# Patient Record
Sex: Male | Born: 1945 | Race: White | Hispanic: No | Smoking: Former smoker
Health system: Southern US, Community
[De-identification: ages and names within clinical notes are randomized; demographics above are authoritative.]

## PROBLEM LIST (undated history)

## (undated) DIAGNOSIS — F419 Anxiety disorder, unspecified: Secondary | ICD-10-CM

## (undated) DIAGNOSIS — Z87442 Personal history of urinary calculi: Secondary | ICD-10-CM

## (undated) DIAGNOSIS — E039 Hypothyroidism, unspecified: Secondary | ICD-10-CM

## (undated) DIAGNOSIS — F32A Depression, unspecified: Secondary | ICD-10-CM

## (undated) DIAGNOSIS — G4730 Sleep apnea, unspecified: Secondary | ICD-10-CM

## (undated) DIAGNOSIS — K219 Gastro-esophageal reflux disease without esophagitis: Secondary | ICD-10-CM

## (undated) DIAGNOSIS — H409 Unspecified glaucoma: Secondary | ICD-10-CM

## (undated) DIAGNOSIS — I10 Essential (primary) hypertension: Secondary | ICD-10-CM

## (undated) DIAGNOSIS — M1990 Unspecified osteoarthritis, unspecified site: Secondary | ICD-10-CM

## (undated) DIAGNOSIS — F329 Major depressive disorder, single episode, unspecified: Secondary | ICD-10-CM

## (undated) HISTORY — DX: Gastro-esophageal reflux disease without esophagitis: K21.9

## (undated) HISTORY — DX: Anxiety disorder, unspecified: F41.9

## (undated) HISTORY — PX: CHOLECYSTECTOMY: SHX55

## (undated) HISTORY — DX: Essential (primary) hypertension: I10

## (undated) HISTORY — PX: BACK SURGERY: SHX140

---

## 1998-02-01 ENCOUNTER — Encounter: Payer: Self-pay | Admitting: Emergency Medicine

## 1998-02-01 ENCOUNTER — Emergency Department (HOSPITAL_COMMUNITY): Admission: EM | Admit: 1998-02-01 | Discharge: 1998-02-01 | Payer: Self-pay | Admitting: Emergency Medicine

## 2001-05-19 ENCOUNTER — Ambulatory Visit (HOSPITAL_COMMUNITY): Admission: RE | Admit: 2001-05-19 | Discharge: 2001-05-19 | Payer: Self-pay | Admitting: Family Medicine

## 2001-05-19 ENCOUNTER — Encounter: Payer: Self-pay | Admitting: Family Medicine

## 2001-06-17 ENCOUNTER — Ambulatory Visit (HOSPITAL_COMMUNITY): Admission: RE | Admit: 2001-06-17 | Discharge: 2001-06-17 | Payer: Self-pay | Admitting: Family Medicine

## 2001-06-17 ENCOUNTER — Encounter: Payer: Self-pay | Admitting: Family Medicine

## 2001-10-09 ENCOUNTER — Encounter: Payer: Self-pay | Admitting: Family Medicine

## 2001-10-09 ENCOUNTER — Ambulatory Visit (HOSPITAL_COMMUNITY): Admission: RE | Admit: 2001-10-09 | Discharge: 2001-10-09 | Payer: Self-pay | Admitting: Family Medicine

## 2002-03-12 HISTORY — PX: ESOPHAGOGASTRODUODENOSCOPY: SHX1529

## 2002-09-19 ENCOUNTER — Emergency Department (HOSPITAL_COMMUNITY): Admission: EM | Admit: 2002-09-19 | Discharge: 2002-09-19 | Payer: Self-pay | Admitting: Emergency Medicine

## 2002-10-02 ENCOUNTER — Ambulatory Visit (HOSPITAL_COMMUNITY): Admission: RE | Admit: 2002-10-02 | Discharge: 2002-10-02 | Payer: Self-pay | Admitting: Family Medicine

## 2002-10-02 ENCOUNTER — Encounter: Payer: Self-pay | Admitting: Family Medicine

## 2002-10-06 ENCOUNTER — Encounter (HOSPITAL_COMMUNITY): Admission: RE | Admit: 2002-10-06 | Discharge: 2002-11-05 | Payer: Self-pay | Admitting: Family Medicine

## 2002-10-06 ENCOUNTER — Encounter: Payer: Self-pay | Admitting: Family Medicine

## 2002-10-16 ENCOUNTER — Observation Stay (HOSPITAL_COMMUNITY): Admission: RE | Admit: 2002-10-16 | Discharge: 2002-10-18 | Payer: Self-pay | Admitting: General Surgery

## 2002-11-20 ENCOUNTER — Ambulatory Visit (HOSPITAL_COMMUNITY): Admission: RE | Admit: 2002-11-20 | Discharge: 2002-11-20 | Payer: Self-pay | Admitting: Family Medicine

## 2002-11-20 ENCOUNTER — Encounter: Payer: Self-pay | Admitting: Family Medicine

## 2002-12-21 ENCOUNTER — Ambulatory Visit (HOSPITAL_COMMUNITY): Admission: RE | Admit: 2002-12-21 | Discharge: 2002-12-21 | Payer: Self-pay | Admitting: Internal Medicine

## 2003-04-22 ENCOUNTER — Ambulatory Visit (HOSPITAL_COMMUNITY): Admission: RE | Admit: 2003-04-22 | Discharge: 2003-04-22 | Payer: Self-pay | Admitting: Family Medicine

## 2003-09-08 ENCOUNTER — Ambulatory Visit (HOSPITAL_COMMUNITY): Admission: RE | Admit: 2003-09-08 | Discharge: 2003-09-08 | Payer: Self-pay | Admitting: Family Medicine

## 2004-11-23 ENCOUNTER — Ambulatory Visit (HOSPITAL_COMMUNITY): Admission: RE | Admit: 2004-11-23 | Discharge: 2004-11-23 | Payer: Self-pay | Admitting: Family Medicine

## 2005-02-14 ENCOUNTER — Ambulatory Visit (HOSPITAL_COMMUNITY): Admission: RE | Admit: 2005-02-14 | Discharge: 2005-02-14 | Payer: Self-pay | Admitting: Family Medicine

## 2006-02-11 ENCOUNTER — Inpatient Hospital Stay (HOSPITAL_COMMUNITY): Admission: AD | Admit: 2006-02-11 | Discharge: 2006-02-14 | Payer: Self-pay | Admitting: Family Medicine

## 2006-02-28 ENCOUNTER — Ambulatory Visit (HOSPITAL_COMMUNITY): Admission: RE | Admit: 2006-02-28 | Discharge: 2006-03-01 | Payer: Self-pay | Admitting: Neurosurgery

## 2007-10-31 ENCOUNTER — Emergency Department (HOSPITAL_COMMUNITY): Admission: EM | Admit: 2007-10-31 | Discharge: 2007-10-31 | Payer: Self-pay | Admitting: Emergency Medicine

## 2007-11-05 ENCOUNTER — Ambulatory Visit (HOSPITAL_COMMUNITY): Admission: RE | Admit: 2007-11-05 | Discharge: 2007-11-05 | Payer: Self-pay | Admitting: Urology

## 2007-11-07 ENCOUNTER — Ambulatory Visit (HOSPITAL_COMMUNITY): Admission: RE | Admit: 2007-11-07 | Discharge: 2007-11-07 | Payer: Self-pay | Admitting: Urology

## 2007-11-11 ENCOUNTER — Ambulatory Visit (HOSPITAL_COMMUNITY): Admission: RE | Admit: 2007-11-11 | Discharge: 2007-11-11 | Payer: Self-pay | Admitting: Urology

## 2007-11-13 ENCOUNTER — Ambulatory Visit (HOSPITAL_COMMUNITY): Admission: RE | Admit: 2007-11-13 | Discharge: 2007-11-13 | Payer: Self-pay | Admitting: Urology

## 2010-04-01 ENCOUNTER — Encounter: Payer: Self-pay | Admitting: Internal Medicine

## 2010-07-25 NOTE — H&P (Signed)
NAME:  John Melendez, John Melendez                ACCOUNT NO.:  [id number]   MEDICAL RECORD NO.:  [id number]          PATIENT TYPE:  AMB   LOCATION:  DAY                           FACILITY:  [facility]   PHYSICIAN:  Dennie Maizes, M.D.   DATE OF BIRTH:  02/06/1946   DATE OF ADMISSION:  DATE OF DISCHARGE:  LH                              HISTORY & PHYSICAL   CHIEF COMPLAINT:  Right flank pain, right distal ureteral calculi with  obstruction.   HISTORY OF PRESENT ILLNESS:  This 65 year old male was referred today  from the emergency room at [facility].   PAST HISTORY:  Recurrent urolithiasis.   PAST SURGERIES:  Three or four stones in the past.   He came to emergency room on November 10, 2007.  He had severe right flank  pain radiating to the front.  Evaluation was done with a CT scan of  abdomen and pelvis without contrast.  This revealed a 9 x 6-mm size  right distal calculi on the lower left pelvic brim with obstruction  hydronephrosis.  The patient has undergone extracorporeal shockwave  lithotripsy of obstructing right distal ureteral calculus.  Stone has  been fragmented well.  He passed small stone fragments.  He still has a  group of stone fragments at the right distal ureter with obstruction and  severe pain.  He is brought to the short-stay center today for  cystoscopy, retrograde pyelogram, right ureteroscopy, Holmium  lithotripsy and stent placement.   PAST MEDICAL HISTORY:  1. History of recurrent urolithiasis status post ESL of right distal      ureteral calculus in August 2009.  2. Hypertension.  3. Anxiety.  4. Vitiligo.  5. Status post cholecystectomy.  6. Status post this surgery.   MEDICATIONS:  Nasacort, Claritin, Xanax, Cardura, over-the-counter  medications for gastroesophageal reflux disease, Percocet p.r.n. for  pain.   ALLERGIES:  PENICILLIN.   PHYSICAL EXAMINATION:  Height 6 feet, 1 inch; weight 200 pounds.  HEAD, EYES, EARS, NOSE AND THROAT: Normal.  LUNGS:  Clear to auscultation.  HEART: Regular rate and rhythm.  No murmurs.  ABDOMEN: Soft.  No palpable flank mass.  Moderate right costovertebral  angle tenderness is noted.  Penis and testes are normal.  The patient has hyperpigmented patches all over the body suggestive of  vitiligo.   IMPRESSION:  Right distal ureteral calculus status post extracorporeal  shock wave lithotripsy (ESWL) , right distal ureteral stone fragments  with obstruction, right renal colic.   PLAN:  Cystoscopy, retrograde pyelogram, right ureteroscopy, holmium  laser lithotripsy and extraction of stones and stent placement in short-  stay center.  I have discussed with the patient and his wife regarding  the diagnosis, operative details, alternative treatments and outcomes,  possible risks and complications and he has agreed for the procedure to  be done.      Dennie Maizes, M.D.  Electronically Signed     SK/MEDQ  D:  11/12/2007  T:  11/12/2007  Job:  [id number]   cc:   Short Stay Center   Kirk Ruths, M.D.  Fax: [phone]

## 2010-07-25 NOTE — Op Note (Signed)
NAME:  John Melendez, John Melendez                ACCOUNT NO.:  [id number]   MEDICAL RECORD NO.:  [id number]          PATIENT TYPE:  AMB   LOCATION:  DAY                           FACILITY:  [facility]   PHYSICIAN:  Dennie Maizes, M.D.   DATE OF BIRTH:  Dec 10, 1945   DATE OF PROCEDURE:  11/13/2007  DATE OF DISCHARGE:                               OPERATIVE REPORT   PREOPERATIVE DIAGNOSES:  Right distal ureteral calculus with  obstruction, right flank pain.   POSTOPERATIVE DIAGNOSES:  Right distal ureteral calculus with  obstruction, right flank pain.   OPERATIVE PROCEDURES:  Cystoscopy, retrograde pyelogram, right  ureteroscopy, basket extraction of stone, and right ureteral stent  placement.   ANESTHESIA:  General.   SURGEON:  Dennie Maizes, MD   COMPLICATIONS:  None.   DRAINS:  6.26 cm size right ureteral stent with a string.   ESTIMATED BLOOD LOSS:  Minimal.   SPECIMEN:  Right ureteral stone, which was given to the patient.   COMPLICATIONS:  None.   INDICATIONS FOR THE PROCEDURE:  This is a 65 year old male had a large  right ureteral calculus obstruction.  The stone measured 96 mm in size.  It was treated with the ESL as an outpatient.  The past small stone  fragments.  X-rays revealed a large stone fragment with obstruction and  severe pain.  The patient was brought to the OR today for cystoscopy,  retrograde pyelogram, right ureteroscopy stone extraction, and stent  placement.   DESCRIPTION OF PROCEDURE:  General anesthesia was induced and the  patient was placed on the OR table in the dorsal lithotomy position.  The lower abdomen and the liver were prepped and draped in a sterile  fashion.  Cystoscopy was done at 25-French scope.  Urethra was normal.  There was moderate hypertrophy of prostate partial obstruction of  bladder neck area.  The bladder found to be normal.  A 5-French wedge  catheter was then placed in the right ureteral orifice and about 7 mL of  Renografin-60 was  injected into the collecting system.  There was large  filling defect in the distal ureter just above ureterovesical junction.  There was proximal hydroureter and hydronephrosis.   A 5-French open-ended catheter was then placed in right ureteral  arteries.  A 0.038 Bentson guidewire with a flexible tip was then  advanced in the right renal pelvis.  The distal ureter was then dilated  using a 18 French 6-cm size balloon dilating catheter.  Balloon dilating  catheter was then removed leaving the guidewire in place.   Ureteroscopy was done with a 8.5-French rigid ureteroscope.  There was  large stone fragments with several small stone fragments in the right  distal ureter.  The nitinol 0 tip wire basket, the last stone fragment  was removed.  The small stone fragments washed out with the irrigation  fluid.  Examination of the ureter was done up to the pelvic brim and no  other abnormality was noted.  There was proximal hydroureter.   A 6.26-cm size stent with a string was then inserted into the right  collecting  system.  The instruments were removed.  The patient was  transferred to the PACU in a satisfactory condition.      Dennie Maizes, M.D.  Electronically Signed     SK/MEDQ  D:  11/13/2007  T:  11/14/2007  Job:  [id number]   cc:   Kirk Ruths, M.D.  Fax: [phone]

## 2010-07-25 NOTE — H&P (Signed)
NAME:  John Melendez, John Melendez                ACCOUNT NO.:  [id number]   MEDICAL RECORD NO.:  [id number]          PATIENT TYPE:  AMB   LOCATION:  DAY                           FACILITY:  [facility]   PHYSICIAN:  Dennie Maizes, M.D.   DATE OF BIRTH:  11-24-1945   DATE OF ADMISSION:  DATE OF DISCHARGE:  LH                              HISTORY & PHYSICAL   He is coming to the short-stay center on November 05, 2007, for  lithotripsy of right distal ureteral calculus.   HISTORY OF PRESENT ILLNESS:  This 65 year old male was referred to me  from the emergency room at [facility].  He has a past history  of recurrent urolithiasis.  He has passed at least 3-4 stones in the  past.  He went to the emergency room on October 31, 2007, with severe  right flank pain radiating to the front.  Evaluation was done with a CT  scan of the abdomen and pelvis without contrast.  This revealed a 9-mm  size right distal ureteral calculus at the level of the pelvic brim with  obstruction and hydronephrosis.  The patient also had small  nonobstructing right lower caliceal stone in the right kidney.  The  patient is unable to pass the stone.  He has urinary frequency x5-6 and  nocturia x1.  He is brought to the short-stay center today for  extracorporeal shock wave lithotripsy of obstructing right distal  ureteral calculus.   PAST MEDICAL HISTORY:  1. History of recurrent urolithiasis.  2. Hypertension.  3. Anxiety.  4. Vitiligo.  5. Status post cholecystectomy.  6. Status post disk surgery.   MEDICATIONS:  Nasacort, Claritin, Xanax, Cardura, over-the-counter  medication for gastroesophageal reflux disease.   ALLERGIES:  HE IS ALLERGIC TO PENICILLIN.   PHYSICAL EXAMINATION:  VITAL SIGNS:  Height 6'1, weight 200 pounds.  SKIN:  The patient has extensive hypopigmented lesions suggestive of  vitiligo.  HEAD, EYES, EARS, NOSE AND THROAT: Normal.  LUNGS:  Clear to auscultation.  HEART:  Regular rate and rhythm.  No  murmurs.  ABDOMEN:  Soft.  No palpable flank mass.  Moderate right costovertebral  angle tenderness was noted.  Bladder not palpable.  Penis and testes are  normal   IMPRESSION:  Right distal ureteral calculus with obstruction (9-mm),  right hydronephrosis, right renal colic.   PLAN:  Extracorporeal shock wave lithotripsy of obstructing right distal  ureteral calculi with IV sedation in the short-stay center.  I have  informed the patient and his family regarding the diagnosis, operative  details, alternative treatments, outcome and possible risks and  complications, and he has agreed for the procedure to be done.      Dennie Maizes, M.D.  Electronically Signed     SK/MEDQ  D:  11/05/2007  T:  11/05/2007  Job:  [id number]   cc:   Kirk Ruths, M.D.  Fax: [phone]   [facility]

## 2010-07-28 NOTE — Op Note (Signed)
   NAME:  John Melendez, John Melendez                          ACCOUNT NO.:  [id number]   MEDICAL RECORD NO.:  [id number]                   PATIENT TYPE:  AMB   LOCATION:  DAY                                  FACILITY:  [facility]   PHYSICIAN:  R. Roetta Sessions, M.D.              DATE OF BIRTH:  August 23, 1945   DATE OF PROCEDURE:  12/21/2002  DATE OF DISCHARGE:                                 OPERATIVE REPORT   PROCEDURE:  Esophagogastroduodenoscopy with Elease Hashimoto dilation.   ENDOSCOPIST:  Gerrit Friends. Rourk, M.D.   INDICATIONS FOR PROCEDURE:  The patient is a 65 year old gentleman with  upper abdominal pain and esophageal dysphagia to solids. EGD is now being  done to further evaluate his symptoms.  He has been on Aciphex 20 mg orally  daily with good control of his reflux symptoms.  Please see my dictated H&P,  December 09, 2002, for more information.   PROCEDURE NOTE:  O2 saturation, blood pressure, pulse and respirations were  monitored throughout the entire procedure.  Conscious sedation: Versed 5 mg  IV, Demerol 125 mg IV in divided doses.   INSTRUMENT:  Olympus video chip adult gastroscope.   FINDINGS:  Examination of the tubular esophagus revealed noncritical  appearing Schatzki ring; otherwise the esophageal mucosa appeared normal.  The EG junction was easily traversed.   STOMACH:  The gastric cavity was empty.  It insufflated well with air.  A  thorough examination of the gastric mucosa including a retroflex view of the  proximal stomach and esophagogastric junction demonstrated only a small  hiatal hernia. The pylorus was patent and easily traversed.   DUODENUM:  The bulb and the second portion appeared normal.   THERAPEUTIC/DIAGNOSTIC MANEUVERS:  A 56 French Maloney dilator was passed to  full insertion with good patient tolerance. A look back revealed no apparent  complication related to the passage of the dilator.   The patient tolerated the procedure well and was reacted in  endoscopy.   IMPRESSION:  1. Noncritical appearing Schatzki ring, status post dilation as described     above, otherwise normal esophagus.  2.     Small hiatal hernia, otherwise normal stomach.  3. Normal D1 and D2.   RECOMMENDATIONS:  Also follow up with [place] in 1 month, continue Aciphex 20 mg  orally daily.      ___________________________________________                                            Jonathon Bellows, M.D.   RMR/MEDQ  D:  12/21/2002  T:  12/21/2002  Job:  [id number]   cc:   Corrie Mckusick, M.D.  [address] [place] [place]  [place]  [place] [address]  Fax: [phone]

## 2010-07-28 NOTE — Consult Note (Signed)
NAME:  John Melendez, John Melendez                          ACCOUNT NO.:  [id number]   MEDICAL RECORD NO.:  [id number]                   PATIENT TYPE:   LOCATION:                                       FACILITY:   PHYSICIAN:  R. Roetta Sessions, M.D.              DATE OF BIRTH:  03/13/1945   DATE OF CONSULTATION:  DATE OF DISCHARGE:                                   CONSULTATION   CONSULTING PHYSICIAN:  R. Roetta Sessions, M.D.   PRIMARY CARE PHYSICIAN:  Patrica Duel, M.D.   REASON FOR CONSULTATION:  Abdominal pain, back pain.   HISTORY OF PRESENT ILLNESS:  Mr. Shiven Junious is a pleasant 65 year old  gentleman kindly sent over courtesy of Dr. Patrica Duel to further evaluate  a six or so week history of upper abdominal back pain.  He has a long  history of what he describes as heartburn.  He has been on various  medications for acid suppression, over the years.  He also describes  esophageal dysphagia which he states is improved somewhat since he had his  gallbladder out six weeks ago by Dr. Malvin Johns.  Apparently he had biliary  dyskinesia and cholelithiasis.  I do not have any of those reports available  for review.  He tells me, after undergoing a HIDA scan (based on what he  describes to me), he started having back pain after he got off the table in  the x-ray department.  He does have some vague epigastric pain off and on,  sometimes relieved and sometimes exacerbated by eating.  No change in bowel  habits.  No melena, rectal bleeding, constipation, diarrhea.  No change in  weight.  He describes undergoing a barium pill esophagram one year ago which  apparently did not show any impediment to barium bill transit, although I do  not have that study for review at this time either.  He denies any form of  nonsteroidals.  He stopped smoking and drinking 11 years ago.  He takes  Aciphex 20 mg orally daily.   PAST MEDICAL HISTORY:  1. Significant for anxiety neurosis.  2. Hypertension.  3.  Seasonal allergies.   PAST SURGICAL HISTORY:  1. Cholecystectomy.  He has never his lower GI tract imaged.   CURRENT MEDICATIONS:  1. Xanax 0.5 mg p.r.n.  2. Cardura 6 mg daily.  3. Aciphex 20 mg daily.  4. Nasacort daily.  5. Claritin 10 mg daily.   ALLERGIES:  PENICILLIN.   FAMILY HISTORY:  Father died of unknown causes.  Mother had an MI and renal  failure.  No history of chronic GI or liver illness.   SOCIAL HISTORY:  The patient is single.  Has seven children.  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  No tobacco or alcohol for  the past 11 years.  Fairly heavy on both prior to that time.   REVIEW  OF SYSTEMS:  As in history of present illness.   PHYSICAL EXAMINATION:  Reveals a pleasant 65 year old gentleman who has a  mustache.  He does have a vitiliginous changes involving the skin of both  hands.  Weight 180.5, height 6 foot 1 inch, temp 98.4, BP 120/72, pulse 70.  SKIN:  Warm and dry.  Vitiligo over both distal upper extremities.  No  jaundice, no cutaneous stigmata of chronic liver disease.  HEENT:  No scleral icterus.  Oral cavity, no lesions.  JVD is not prominent.  CHEST:  Lungs are clear to auscultation.  CARDIAC:  Regular, rate and rhythm without murmur, gallop or rub.  ABDOMEN:  Nondistended.  Positive bowel sounds.  Soft, nontender without  appreciable mass, organomegaly.  He does not have any CVA tenderness or  tenderness to percussion over the spine.   IMPRESSION:  Mr. Danarius Mcconathy is a pleasant 65 year old gentleman with a  longstanding history of gastroesophageal reflux disease, intermittent  esophageal dysphagia somewhat improved after cholecystectomy recently.  He  has as much back pain as abdominal pain.  It does not sound like an aneurism  was detected on recent imaging studies, but I need to get the reports to  review.  He really needs to have an esophagogastroduodenoscopy if nothing  more to evaluate his longstanding reflux  symptoms.  We need to rule out  peptic ulcer disease.   RECOMMENDATIONS:  1. EGD with a possible esophageal dilation as appropriate at [facility] in the near future.  2. Review the multitude of labs and radiographic studies done on him     recently.  3. In the near future, we will make further recommendations.   I have discussed the approach of EGD with possible esophageal dilation with  Mr. Macphee, potential risks, benefits, alternatives have been reviewed,  questions answered.  He is agreeable.   I would like to thank Dr. Patrica Duel for allowing me to see this nice  gentleman today.  We will make further recommendations in the near future.      ___________________________________________                                            Jonathon Bellows, M.D.   RMR/MEDQ  D:  12/09/2002  T:  12/09/2002  Job:  [id number]   cc:   Patrica Duel, M.D.  [address]  [place]  [place] [address]  Fax: [phone]

## 2010-07-28 NOTE — Op Note (Signed)
NAME:  John Melendez, John Melendez                ACCOUNT NO.:  [id number]   MEDICAL RECORD NO.:  [id number]          PATIENT TYPE:  OIB   LOCATION:  3023                         FACILITY:  [facility]   PHYSICIAN:  Danae Orleans. Venetia Maxon, M.D.  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   PROCEDURE:  Left L5-S1 far lateral microdiskectomy with METRx Quadrant  retractor and a microdissection.   SURGEON:  Danae Orleans. Venetia Maxon, M.D.   ASSISTANT:  Georgiann Cocker, RN.   ANESTHESIA:  General endotracheal anesthesia.   BLOOD LOSS:  Minimal.   COMPLICATIONS:  None.   DISPOSITION:  Recovery.   INDICATIONS:  John Melendez is a 65 year old man with severe left leg  pain with an L5 radiculopathy with far lateral disk herniation at L5-S1  on the left.  It was elected to take him to surgery for far lateral  microdiskectomy.   PROCEDURE:  John Melendez was brought to the operating room.  Following  satisfactory and uncomplicated induction of general endotracheal  anesthesia and placement of intravenous lines, the patient was placed in  a prone position on the Wilson frame.  His low back was then prepped and  draped in the usual sterile fashion.  Using fluoroscopy, the left  paramedian approach to the left L5 pars was identified and a 2 cm  incision was made after the skin and subcutaneous tissues were  infiltrated with 0.25% Marcaine and 0.50% lidocaine with 1:200,000  epinephrine.  Incision was carried through skin and fascia and then  using initially a K-wire to localize the correct level and position and  then subsequently using a sequential dilators, dissection of soft tissue  was performed  overlying the left pars of L5 and then sequential dilation  was performed up to 22 mm cannula.  A 6 cm long x 22 mm Quadrant  retractor was then placed and anchored to the standard flex-arm bed  anchor.  The quadrant tractor was then opened and also medial, lateral  retractors were placed.  After a final a localizing x-ray was obtained,  the thin-layer of muscle overlying the pars was then removed with  electrocautery and a Leksell rongeur.  The lateral aspect of the pars  and superolateral facette were then thinned with a high-speed drill and  bone was removed with Kerrison rongeur.  This was done under loupe  magnification.  Subsequently, the microscope was brought into field and  under microdissection technique, an extruded fragment of disk was  identified, the nerve root was found to be pushed cephalad and under  fair amount of pressure.  Using micropituitary, a very large fragment of  disk material was removed with resultant significant decompression of  the nerve root.  The space beneath the fragment of disk was palpated  with a variety of hooks and there did  not appear to be any significant  residual disk material.  Several additional small pieces of disk  material were removed.  The epidural veins were identified and left  intact.  The nerve root appeared to be significantly decompressed.  Hemostasis was assured and the wound was irrigated with bacitracin and  saline and subsequently Depo-Medrol and fentanyl was placed into the  operative bed.  The Quadrant retractor was then removed and the fascia  was closed with 2-0 Vicryl sutures and the skin edges were approximated  2-0 and 3-0 Vicryl interrupted inverted sutures.  The wound was dressed  with Dermabond.  The patient was extubated in the operating room and  taken to the recovery room in stable satisfactory condition, having  tolerated his  operation well.  Counts correct at the end of the case.      Danae Orleans. Venetia Maxon, M.D.   Electronically Signed     JDS/MEDQ  D:  02/28/2006  T:  02/28/2006  Job:  [id number]

## 2010-07-28 NOTE — H&P (Signed)
NAME:  John Melendez, John Melendez                ACCOUNT NO.:  [id number]   MEDICAL RECORD NO.:  [id number]          PATIENT TYPE:  INP   LOCATION:  A331                          FACILITY:  [facility]   PHYSICIAN:  Kirk Ruths, M.D.DATE OF BIRTH:  1945/06/07   DATE OF ADMISSION:  02/11/2006  DATE OF DISCHARGE:  LH                              HISTORY & PHYSICAL   CHIEF COMPLAINT:  Severe back pain.   HISTORY OF PRESENT ILLNESS:  This is a 65 year old male who started  having low back pain approximately three weeks ago.  The patient, at the  time, was bending over and straining.  Initially was treated with  Flexeril and Lorcet Plus and rest.  The patient's pain actually got  worse.  He was seen again a week ago in the office at which time he was  put on steroid dose pack, increased to Lorcet 10 and MRI was ordered.  The patient has been unable to tolerated MRI twice, unable to lay on the  bed because of severe pain.  Pain began running down his left lower  extremity.  The patient was admitted for IV pain control and hopefully  he will have a diagnostic MRI.   ALLERGIES:  PENICILLIN.   PAST MEDICAL HISTORY:  1. Hypertension.  2. Vitiligo.  3. GERD.  4. Panic disorder.   MEDICATIONS:  1. Xanax 0.5 p.r.n.  2. Aciphex 20.  3. Cardura 6 mg daily.  4. Allegra and Nasacort p.r.n.  5. Enteric coated aspirin.   REVIEW OF SYSTEMS:  Denies chest pain, shortness of breath, nausea and  vomiting.  Does state he has severe burning and pain down his left leg  posteriorly to his foot.  Denies incontinence of stool or bladder.   SOCIAL HISTORY:  Nonsmoker, nondrinker.  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   PHYSICAL EXAMINATION:  GENERAL:  A middle aged male who appears in pain  and using a walker which is new for him.  VITAL SIGNS:  Blood pressure 150/80, respirations 20 and unlabored,  pulse 70 and regular.  He is afebrile.  HEENT:  TM's normal.  Pupils equal, round and reactive to light  and  accommodation.  Oropharynx benign.  NECK:  Supple without JVD or thyromegaly.  LUNGS:  Clear.  HEART:  Regular sinus rhythm without murmurs, gallops, rubs.  ABDOMEN:  Soft, nontender.  EXTREMITIES:  Without cyanosis, clubbing or edema.  BACK:  There is a significant LS tenderness, left greater than right  with spasm, significantly positive straight leg raising on the left.  NEUROLOGICAL:  Intact.   ASSESSMENT:  1. Intractable back pain.  2. Hypertension.      Kirk Ruths, M.D.  Electronically Signed     WMM/MEDQ  D:  02/11/2006  T:  02/12/2006  Job:  [id number]

## 2010-07-28 NOTE — Op Note (Signed)
NAME:  John Melendez, John Melendez                          ACCOUNT NO.:  [id number]   MEDICAL RECORD NO.:  [id number]                   PATIENT TYPE:  OBV   LOCATION:  A341                                 FACILITY:  [facility]   PHYSICIAN:  Barbaraann Barthel, M.D.              DATE OF BIRTH:  [birth year]   DATE OF PROCEDURE:  DATE OF DISCHARGE:                                 OPERATIVE REPORT   PREOPERATIVE DIAGNOSIS:  Cholecystitis, biliary dyskinesia and gallbladder  polyps.   POSTOPERATIVE DIAGNOSIS:  Cholecystitis, biliary dyskinesia, gallbladder  polyps, and also the presence of small bilirubinate stones.   PROCEDURE:  Laparoscopic cholecystectomy.   SURGEON:  Marlane Hatcher, M.D.   SPECIMENS:  Gallbladder with stones.   NOTE:  This is a 65 year old white male who had multiple episodes of right  upper quadrant pain and pain radiating across his upper abdomen and back  towards his shoulder blades. This was also postprandial in nature. He has  also had some problems with reflux and GERD in the past.  He underwent upper  GI evaluation a year ago by contrast media only.  Also, preoperatively the  patient had a hepatobiliary scan which suggested biliary dyskinesia with a  diminished ejection fraction.   We discussed the need for surgery in this patient and discussed the  complications, not limited to, but including bleeding, infection, damage to  bile ducts, perforation of organs and transitory diarrhea. Informed consent  was obtained.   GROSS OPERATIVE FINDINGS:  The patient had dense adhesions about the  gallbladder.  These were taken down and a floppy, large gallbladder which  was involved in these adhesions, however, we were able to take this down  without any problems.  There were no other abnormalities noted in the right  upper quadrant.  There were no suspicious liver lesions. There was a  question of some liver lesion on CT scan; however, there was nothing apart  from what  appeared to be a hemangioma near the gallbladder bed.  No other  abnormalities were encountered.   TECHNIQUE:  The patient was placed in the supine position and after the  adequate administration of general anesthesia via endotracheal intubation  his entire abdomen was prepped with Betadine solution and draped in the  usual manner; after a Foley catheter had been aseptically inserted.  With  the patient in Trendelenburg a periumbilical incision was carried out over  the superior aspect of the umbilicus.  The fascia was grasped with a sharp  towel clip and a Veress needle was inserted and confirmed in position with a  saline drop test.  We then used the Visiport technique and a camera was  inserted through the cannula in the umbilicus; and then under direct vision  another 11-mm cannula was placed in the epigastrium and two 5-mm cannulas in  right upper quadrant laterally.   The gallbladder was grasped; and its adhesions  were taken down in a tedious  manner.  There was minimal oozing and bleeding from this. These were  controlled while these were taken down by cautery device.  The gallbladder  was then grasped.  The cystic duct was clearly identified, triply silver  clipped on the side of the common bile duct and singly silver clipped on the  side of the gallbladder and divided.  Likewise, the cystic artery was  identified and clipped.  The gallbladder was then removed from the liver  bed.  This was uneventful.  We removed this using the EndoCatch sac device.  We then checked for hemostasis.  There was some oozing in the liver bed  which was controlled with the cautery device.  Then after irrigating I  elected to leave a piece of Surgicel in the liver bed.  The abdomen was then  desufflated and the fascia was closed in the area of the epigastrium and the  umbilicus with #0 Polysorb and Surgiclips were used to approximate the skin.   Prior to closure, all sponge, needle, and instrument  counts were found to be  correct.  Estimated blood loss was minimal.  No drains were placed.  The  patient received 2400cc of crystalloids intraoperatively.                                               Barbaraann Barthel, M.D.    WB/MEDQ  D:  10/16/2002  T:  10/16/2002  Job:  [id number]   cc:   Kirk Ruths, M.D.  [address]  [place]  [place] [address]  Fax: [phone]

## 2010-07-28 NOTE — Discharge Summary (Signed)
NAME:  John Melendez, John Melendez                ACCOUNT NO.:  [id number]   MEDICAL RECORD NO.:  [id number]          PATIENT TYPE:  INP   LOCATION:  A331                          FACILITY:  [facility]   PHYSICIAN:  Kirk Ruths, M.D.DATE OF BIRTH:  12-12-1945   DATE OF ADMISSION:  02/11/2006  DATE OF DISCHARGE:  12/06/2007LH                               DISCHARGE SUMMARY   DISCHARGE DIAGNOSES:  1. 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  2. 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  3. Hypertension controlled.  4. Gastroesophageal reflux disease controlled.  5. Panic disorder.  6. Vitiligo.   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  The  patient had gotten progressively worse with his low back pain having to  use a walker at the time of admission.  He was unable to undergo MRI as  an outpatient due to discomfort.  He was admitted for intractable back  pain, begun on IV Dilaudid which he did not tolerate well, gave him some  chest pain sweats.  He was well controlled with morphine and Valium.  MRI showed herniated disk with nerve impingement left L5-S1.  Also an  approximately 1 cm cubed soft tissue mass, probable lipoma, in the  intraspinal space of L1.  The patient underwent physical therapy with  some success.  His back pain had improved requiring less pain medication  with time.  He is still with low back but feel his workup is complete  and he will tolerate the problem as an outpatient until neurosurgery  consult is obtained.  He will be discharged home on his previous  medicines including Xanax, AcipHex, Cardura, Allegra, Nasacort, aspirin,  Lorcet 10, and Flexeril.      Kirk Ruths, M.D.  Electronically Signed     WMM/MEDQ  D:  02/14/2006  T:  02/14/2006  Job:   [id number]   cc:   [facility]

## 2011-01-03 ENCOUNTER — Telehealth: Payer: Self-pay

## 2011-01-03 NOTE — Telephone Encounter (Signed)
Pt has never had a colonoscopy. Referred from Dr. Regino Schultze. He has a ruptured disk now and wants to post pone. York Spaniel he will call when he is better.

## 2011-04-23 ENCOUNTER — Telehealth: Payer: Self-pay

## 2011-04-23 NOTE — Telephone Encounter (Signed)
MOVI PREP SPLIT DOSING, REGULAR BREAKFAST. CLEAR LIQUIDS AFTER 9 AM.  

## 2011-04-23 NOTE — Telephone Encounter (Signed)
Pt had been referred also, in 12/2010 and was having back problems and was to call when he was better. I triaged and then he wants to wait til later in March. He will call me back.  Called and triaged today, but he wants to wait and schedule the appt sometime in March. He will call back.    Gastroenterology Pre-Procedure Form  Request Date: 04/23/2011        ,  Requesting Physician: Dr. Regino Schultze     PATIENT INFORMATION:  John Melendez is a 66 y.o., male (DOB=[birth year]).  PROCEDURE: Procedure(s) requested: colonoscopy Procedure Reason: screening for colon cancer  PATIENT REVIEW QUESTIONS: The patient reports the following:   1. Diabetes Melitis: no 2. Joint replacements in the past 12 months: no 3. Major health problems in the past 3 months: no 4. Has an artificial valve or MVP:no 5. Has been advised in past to take antibiotics in advance of a procedure like teeth cleaning: no}    MEDICATIONS & ALLERGIES:    Patient reports the following regarding taking any blood thinners:   Plavix? no Aspirin?no Coumadin?  no  Patient confirms/reports the following medications:  Current Outpatient Prescriptions  Medication Sig Dispense Refill  . ALPRAZolam (XANAX) 0.5 MG tablet Take 0.5 mg by mouth 3 (three) times daily as needed.      Marland Kitchen lisinopril (PRINIVIL,ZESTRIL) 10 MG tablet Take 10 mg by mouth daily.      Marland Kitchen loratadine (CLARITIN) 10 MG tablet Take 10 mg by mouth daily.        Patient confirms/reports the following allergies:  Allergies  Allergen Reactions  . Penicillins Nausea Only    Patient is appropriate to schedule for requested procedure(s): yes  AUTHORIZATION INFORMATION Primary Insurance:   ID #:   Group #:  Pre-Cert / Auth required: Pre-Cert / Auth #:   Secondary Insurance:   ID #:  Group #:  Pre-Cert / Auth required:  Pre-Cert / Auth #:   No orders of the defined types were placed in this encounter.    SCHEDULE INFORMATION: Procedure has been scheduled as  follows:  Date:                                   Time:   Location:   This Gastroenterology Pre-Precedure Form is being routed to the following provider(s) for review: Jonette Eva, MD

## 2011-05-09 NOTE — Telephone Encounter (Signed)
Pt to call back in March to schedule colonoscopy.

## 2011-05-17 ENCOUNTER — Telehealth: Payer: Self-pay

## 2011-05-17 NOTE — Telephone Encounter (Signed)
Called pt to schedule colonoscopy for March. When I told him who I was, the call ended. I called again and the called was picked up on and ended again. I will mail letter for pt to call.

## 2011-06-20 ENCOUNTER — Telehealth: Payer: Self-pay

## 2011-06-20 NOTE — Telephone Encounter (Signed)
Called pt. He declines to schedule colonoscopy. York Spaniel he is going to see Dr. Regino Schultze Friday and they will talk about it. I will send Dr. Regino Schultze a letter of this.

## 2011-08-31 ENCOUNTER — Other Ambulatory Visit: Payer: Self-pay | Admitting: Rehabilitation

## 2011-08-31 DIAGNOSIS — M549 Dorsalgia, unspecified: Secondary | ICD-10-CM

## 2011-09-08 ENCOUNTER — Ambulatory Visit
Admission: RE | Admit: 2011-09-08 | Discharge: 2011-09-08 | Disposition: A | Discharge status: Home / Self Care | Payer: Medicare Other | Source: Ambulatory Visit | Attending: Rehabilitation | Admitting: Rehabilitation

## 2011-09-08 DIAGNOSIS — M549 Dorsalgia, unspecified: Secondary | ICD-10-CM

## 2011-09-08 MED ORDER — GADOBENATE DIMEGLUMINE 529 MG/ML IV SOLN
19.0000 mL | Freq: Once | INTRAVENOUS | Status: AC | PRN
  Administered 2011-09-08: 19 mL via INTRAVENOUS

## 2013-01-02 ENCOUNTER — Other Ambulatory Visit (HOSPITAL_COMMUNITY): Payer: Self-pay | Admitting: Physician Assistant

## 2013-01-02 DIAGNOSIS — IMO0002 Reserved for concepts with insufficient information to code with codable children: Secondary | ICD-10-CM

## 2013-01-06 ENCOUNTER — Other Ambulatory Visit (HOSPITAL_COMMUNITY): Payer: Medicare Other

## 2013-01-06 ENCOUNTER — Ambulatory Visit (HOSPITAL_COMMUNITY): Payer: Medicare Other

## 2013-01-12 ENCOUNTER — Other Ambulatory Visit (HOSPITAL_COMMUNITY): Payer: Self-pay | Admitting: Physician Assistant

## 2013-01-12 DIAGNOSIS — M5137 Other intervertebral disc degeneration, lumbosacral region: Secondary | ICD-10-CM

## 2013-01-13 ENCOUNTER — Other Ambulatory Visit (HOSPITAL_COMMUNITY): Payer: Self-pay | Admitting: Family Medicine

## 2013-01-13 DIAGNOSIS — M5137 Other intervertebral disc degeneration, lumbosacral region: Secondary | ICD-10-CM

## 2013-01-15 ENCOUNTER — Other Ambulatory Visit (HOSPITAL_COMMUNITY): Payer: Medicare Other

## 2013-01-19 ENCOUNTER — Ambulatory Visit (HOSPITAL_COMMUNITY)
Admission: RE | Admit: 2013-01-19 | Discharge: 2013-01-19 | Disposition: A | Discharge status: Home / Self Care | Payer: Medicare Other | Source: Ambulatory Visit | Attending: Family Medicine | Admitting: Family Medicine

## 2013-01-19 DIAGNOSIS — M5126 Other intervertebral disc displacement, lumbar region: Secondary | ICD-10-CM | POA: Insufficient documentation

## 2013-01-19 DIAGNOSIS — M5137 Other intervertebral disc degeneration, lumbosacral region: Secondary | ICD-10-CM

## 2013-01-19 DIAGNOSIS — M545 Low back pain, unspecified: Secondary | ICD-10-CM | POA: Insufficient documentation

## 2013-01-19 DIAGNOSIS — D179 Benign lipomatous neoplasm, unspecified: Secondary | ICD-10-CM | POA: Insufficient documentation

## 2013-01-19 DIAGNOSIS — M51379 Other intervertebral disc degeneration, lumbosacral region without mention of lumbar back pain or lower extremity pain: Secondary | ICD-10-CM | POA: Insufficient documentation

## 2013-10-13 ENCOUNTER — Telehealth: Payer: Self-pay

## 2013-10-13 NOTE — Telephone Encounter (Signed)
Gastroenterology Pre-Procedure Review  Request Date: 10/13/2013 Requesting Physician: Dr. Orson Ape  This will be first colonoscopy for pt. He is very fearful of having it done. Michela Pitcher he would have to have meds to knock him out or it would be bad for caregivers He did fine with his EGD in 2004 per pt. ( Had conscious sedation)  PATIENT REVIEW QUESTIONS: The patient responded to the following health history questions as indicated:    1. Diabetes Melitis: no 2. Joint replacements in the past 12 months: no 3. Major health problems in the past 3 months: no 4. Has an artificial valve or MVP: no 5. Has a defibrillator: no 6. Has been advised in past to take antibiotics in advance of a procedure like teeth cleaning: no    MEDICATIONS & ALLERGIES:    Patient reports the following regarding taking any blood thinners:   Plavix? no Aspirin? no Coumadin? no  Patient confirms/reports the following medications:  Current Outpatient Prescriptions  Medication Sig Dispense Refill  . ALPRAZolam (XANAX) 0.5 MG tablet Take 0.5 mg by mouth 3 (three) times daily as needed.      Marland Kitchen lisinopril (PRINIVIL,ZESTRIL) 10 MG tablet Take 10 mg by mouth daily.      Marland Kitchen loratadine (CLARITIN) 10 MG tablet Take 10 mg by mouth daily.       No current facility-administered medications for this visit.    Patient confirms/reports the following allergies:  Allergies  Allergen Reactions  . Penicillins Nausea Only    No orders of the defined types were placed in this encounter.    AUTHORIZATION INFORMATION Primary Insurance:   ID #:   Group #:  Pre-Cert / Auth required:  Pre-Cert / Auth #:   Secondary Insurance:   ID #:   Group #:  Pre-Cert / Auth required:  Pre-Cert / Auth #:   SCHEDULE INFORMATION: Procedure has been scheduled as follows:  Date:                       Time:   Location:   This Gastroenterology Pre-Precedure Review Form is being routed to the following provider(s): R. Garfield Cornea, MD

## 2013-10-13 NOTE — Telephone Encounter (Signed)
If he wants to be "knocked out", probably best to come in for an appt to discuss further.

## 2013-10-14 NOTE — Telephone Encounter (Signed)
I called pt and he is scheduled OV with Laban Emperor, NP on 11/25/2013 at 3:00 PM.

## 2013-10-27 ENCOUNTER — Encounter (INDEPENDENT_AMBULATORY_CARE_PROVIDER_SITE_OTHER): Payer: Self-pay

## 2013-10-27 ENCOUNTER — Encounter: Payer: Self-pay | Admitting: Gastroenterology

## 2013-10-27 ENCOUNTER — Ambulatory Visit (INDEPENDENT_AMBULATORY_CARE_PROVIDER_SITE_OTHER): Payer: Medicare Other | Admitting: Gastroenterology

## 2013-10-27 VITALS — BP 143/80 | HR 68 | Temp 98.2°F | Ht 73.0 in | Wt 204.2 lb

## 2013-10-27 DIAGNOSIS — Z1211 Encounter for screening for malignant neoplasm of colon: Secondary | ICD-10-CM

## 2013-10-27 MED ORDER — PEG 3350-KCL-NA BICARB-NACL 420 G PO SOLR: 4000.0000 mL | ORAL | Status: DC

## 2013-10-27 NOTE — Progress Notes (Signed)
Primary Care Physician:  Leonides Grills, MD Primary Gastroenterologist:  Dr. Gala Romney   Chief Complaint  Patient presents with  . Colonoscopy    HPI:   John Melendez presents today as a referral for initial screening colonoscopy. States he has been told for years to have a colonoscopy. Just not been an idea he has liked. Wakes up a few hours before the alarm clock goes off, noting lower abdominal discomfort. Will get up to walk around, and it goes away. 1.5 hours later has a BM. Now a normal, daily thing. Bristol scale #4. States usually one time a day. No rectal bleeding. No unexplained weight loss. No lack of appetite. Drinks a lot of diet drinks. Switched to caffeine free and then feels this is when he started to have problems with his stomach. No dysphagia. No reflux symptoms.   Past Medical History  Diagnosis Date  . GERD (gastroesophageal reflux disease)     no PPI currently, symptoms controlled with diet and behavior  . Hypertension   . Anxiety     Past Surgical History  Procedure Laterality Date  . Back surgery    . Cholecystectomy    . Esophagogastroduodenoscopy  2004    Dr. Gala Romney: non-critical Schatzki's ring s/p dilation with 16 F, small hiatal hernia, normal D1 and D2     Current Outpatient Prescriptions  Medication Sig Dispense Refill  . ALPRAZolam (XANAX) 0.5 MG tablet Take 0.5 mg by mouth 3 (three) times daily as needed.      Marland Kitchen lisinopril (PRINIVIL,ZESTRIL) 10 MG tablet Take 10 mg by mouth daily.      Marland Kitchen loratadine (CLARITIN) 10 MG tablet Take 10 mg by mouth daily.      . polyethylene glycol-electrolytes (TRILYTE) 420 G solution Take 4,000 mLs by mouth as directed.  4000 mL  0   No current facility-administered medications for this visit.    Allergies as of 10/27/2013 - Review Complete 10/27/2013  Allergen Reaction Noted  . Penicillins Nausea Only 04/23/2011    Family History  Problem Relation Age of Onset  . Colon cancer Neg Hx     History    Social History  . Marital Status: Divorced    Spouse Name: N/A    Number of Children: N/A  . Years of Education: N/A   Occupational History  . Not on file.   Social History Main Topics  . Smoking status: Never Smoker   . Smokeless tobacco: Not on file  . Alcohol Use: No  . Drug Use: No  . Sexual Activity: Not on file   Other Topics Concern  . Not on file   Social History Narrative  . No narrative on file    Review of Systems: As mentioned in HPI.   Physical Exam: BP 143/80  Pulse 68  Temp(Src) 98.2 F (36.8 C) (Oral)  Ht 6\' 1"  (1.854 m)  Wt 204 lb 3.2 oz (92.625 kg)  BMI 26.95 kg/m2 General:   Alert and oriented. Pleasant and cooperative. Well-nourished and well-developed.  Head:  Normocephalic and atraumatic. Eyes:  Without icterus, sclera clear and conjunctiva pink.  Ears:  Normal auditory acuity. Nose:  No deformity, discharge,  or lesions. Mouth:  No deformity or lesions, oral mucosa pink.  Lungs:  Clear to auscultation bilaterally. No wheezes, rales, or rhonchi. No distress.  Heart:  S1, S2 present without murmurs appreciated.  Abdomen:  +BS, soft, non-tender and non-distended. No HSM noted. No guarding or rebound. No masses  appreciated.  Rectal:  Deferred  Msk:  Symmetrical without gross deformities. Normal posture. Extremities:  Without clubbing or edema. Neurologic:  Alert and  oriented x4;  grossly normal neurologically. Skin:  Intact without significant lesions or rashes. Psych:  Alert and cooperative. Normal mood and affect.

## 2013-10-27 NOTE — Patient Instructions (Signed)
We have scheduled you for a colonoscopy with Dr. Gala Romney in the near future.  Start taking a probiotic once daily. Some examples include Digestive Advantage, Philip's Colon Health, Align, and Restora.  If you have constipation, you may take Miralax 1 capful nightly as needed.   I have attached a diet sheet about high fiber to follow.   High-Fiber Diet Fiber is found in fruits, vegetables, and grains. A high-fiber diet encourages the addition of more whole grains, legumes, fruits, and vegetables in your diet. The recommended amount of fiber for adult males is 38 g per day. For adult females, it is 25 g per day. Pregnant and lactating women should get 28 g of fiber per day. If you have a digestive or bowel problem, ask your caregiver for advice before adding high-fiber foods to your diet. Eat a variety of high-fiber foods instead of only a select few type of foods.  PURPOSE  To increase stool bulk.  To make bowel movements more regular to prevent constipation.  To lower cholesterol.  To prevent overeating. WHEN IS THIS DIET USED?  It may be used if you have constipation and hemorrhoids.  It may be used if you have uncomplicated diverticulosis (intestine condition) and irritable bowel syndrome.  It may be used if you need help with weight management.  It may be used if you want to add it to your diet as a protective measure against atherosclerosis, diabetes, and cancer. SOURCES OF FIBER  Whole-grain breads and cereals.  Fruits, such as apples, oranges, bananas, berries, prunes, and pears.  Vegetables, such as green peas, carrots, sweet potatoes, beets, broccoli, cabbage, spinach, and artichokes.  Legumes, such split peas, soy, lentils.  Almonds. FIBER CONTENT IN FOODS Starches and Grains / Dietary Fiber (g)  Cheerios, 1 cup / 3 g  Corn Flakes cereal, 1 cup / 0.7 g  Rice crispy treat cereal, 1 cup / 0.3 g  Instant oatmeal (cooked),  cup / 2 g  Frosted wheat cereal, 1 cup  / 5.1 g  Brown, long-grain rice (cooked), 1 cup / 3.5 g  White, long-grain rice (cooked), 1 cup / 0.6 g  Enriched macaroni (cooked), 1 cup / 2.5 g Legumes / Dietary Fiber (g)  Baked beans (canned, plain, or vegetarian),  cup / 5.2 g  Kidney beans (canned),  cup / 6.8 g  Pinto beans (cooked),  cup / 5.5 g Breads and Crackers / Dietary Fiber (g)  Plain or honey graham crackers, 2 squares / 0.7 g  Saltine crackers, 3 squares / 0.3 g  Plain, salted pretzels, 10 pieces / 1.8 g  Whole-wheat bread, 1 slice / 1.9 g  White bread, 1 slice / 0.7 g  Raisin bread, 1 slice / 1.2 g  Plain bagel, 3 oz / 2 g  Flour tortilla, 1 oz / 0.9 g  Corn tortilla, 1 small / 1.5 g  Hamburger or hotdog bun, 1 small / 0.9 g Fruits / Dietary Fiber (g)  Apple with skin, 1 medium / 4.4 g  Sweetened applesauce,  cup / 1.5 g  Banana,  medium / 1.5 g  Grapes, 10 grapes / 0.4 g  Orange, 1 small / 2.3 g  Raisin, 1.5 oz / 1.6 g  Melon, 1 cup / 1.4 g Vegetables / Dietary Fiber (g)  Green beans (canned),  cup / 1.3 g  Carrots (cooked),  cup / 2.3 g  Broccoli (cooked),  cup / 2.8 g  Peas (cooked),  cup / 4.4 g  Mashed potatoes,  cup / 1.6 g  Lettuce, 1 cup / 0.5 g  Corn (canned),  cup / 1.6 g  Tomato,  cup / 1.1 g Document Released: 02/26/2005 Document Revised: 08/28/2011 Document Reviewed: 05/31/2011 [facility] Patient Information 2015 [place], [place]. This information is not intended to replace advice given to you by your health care provider. Make sure you discuss any questions you have with your health care provider.

## 2013-10-30 ENCOUNTER — Encounter: Payer: Self-pay | Admitting: Gastroenterology

## 2013-10-30 NOTE — Assessment & Plan Note (Signed)
68 year old male with need for initial screening colonoscopy. Vague lower abdominal discomfort that seems to be related to bowel habits; no concerning signs such as rectal bleeding, weight loss, outright abdominal pain per se. Follow a high fiber diet, Miralax prn, add probiotic.   Proceed with TCS with Dr. Gala Romney in near future: the risks, benefits, and alternatives have been discussed with the patient in detail. The patient states understanding and desires to proceed.

## 2013-11-04 ENCOUNTER — Encounter (HOSPITAL_COMMUNITY): Payer: Self-pay | Admitting: Pharmacy Technician

## 2013-11-04 NOTE — Progress Notes (Signed)
cc'd to pcp 

## 2013-11-23 ENCOUNTER — Ambulatory Visit (HOSPITAL_COMMUNITY): Admission: RE | Admit: 2013-11-23 | Payer: Medicare Other | Source: Ambulatory Visit | Admitting: Internal Medicine

## 2013-11-23 ENCOUNTER — Encounter (HOSPITAL_COMMUNITY): Admission: RE | Payer: Self-pay | Source: Ambulatory Visit

## 2013-11-23 SURGERY — COLONOSCOPY
Anesthesia: Moderate Sedation

## 2013-11-25 ENCOUNTER — Ambulatory Visit: Payer: Medicare Other | Admitting: Gastroenterology

## 2013-12-22 ENCOUNTER — Telehealth: Payer: Self-pay | Admitting: Internal Medicine

## 2013-12-22 NOTE — Telephone Encounter (Signed)
Pt was seen back in AUG to be set up for a tcs and had to cancel. Now he is having acid reflux during the night and said that [facility] had given him samples (doesn't remember the name) that helped him some with that and wants a prescription called into [facility].  I made him an OV with [facility] on 11/17 at 0930.  He wanted to know why he needed to come back and I told him if he was having problems and it has been over 30 days we need an updated history and physical before scheduling him his procedure. He asked for someone to call him back at [phone]

## 2013-12-23 NOTE — Telephone Encounter (Signed)
Routing to [facility] for review. 

## 2013-12-23 NOTE — Telephone Encounter (Signed)
When pt was seen in the office he denied any reflux symptoms. The only samples [facility] gave him were probiotics, which are available otc. This is a new problem and pt is going to need an office visit please.

## 2013-12-24 ENCOUNTER — Encounter: Payer: Self-pay | Admitting: Internal Medicine

## 2013-12-24 ENCOUNTER — Telehealth: Payer: Self-pay | Admitting: Internal Medicine

## 2013-12-24 NOTE — Telephone Encounter (Signed)
Yes. Agree as this is new finding.

## 2013-12-24 NOTE — Telephone Encounter (Signed)
Pt called again today about having something called into [facility] or either getting some samples to hold him until his OV. He uses [facility] and would like to know something today. Please call/advise [phone]

## 2013-12-24 NOTE — Telephone Encounter (Signed)
Pt is aware of OV and appt card mailed

## 2013-12-25 MED ORDER — PANTOPRAZOLE SODIUM 40 MG PO TBEC: 40.0000 mg | DELAYED_RELEASE_TABLET | Freq: Every day | ORAL | Status: DC

## 2013-12-25 NOTE — Telephone Encounter (Signed)
I sent Protonix to the pharmacy. Start this and we will assess at [place].

## 2013-12-28 NOTE — Telephone Encounter (Signed)
Pt is aware and is taking the protonix, he will let us know how hes doing when he comes in for his ov.

## 2014-01-26 ENCOUNTER — Encounter: Payer: Self-pay | Admitting: Gastroenterology

## 2014-01-26 ENCOUNTER — Other Ambulatory Visit: Payer: Self-pay

## 2014-01-26 ENCOUNTER — Ambulatory Visit (INDEPENDENT_AMBULATORY_CARE_PROVIDER_SITE_OTHER): Payer: Medicare Other | Admitting: Gastroenterology

## 2014-01-26 VITALS — BP 126/80 | HR 62 | Temp 97.9°F | Ht 73.0 in | Wt 206.0 lb

## 2014-01-26 DIAGNOSIS — Z1211 Encounter for screening for malignant neoplasm of colon: Secondary | ICD-10-CM

## 2014-01-26 DIAGNOSIS — R1013 Epigastric pain: Secondary | ICD-10-CM

## 2014-01-26 MED ORDER — PEG 3350-KCL-NA BICARB-NACL 420 G PO SOLR: 4000.0000 mL | Freq: Once | ORAL | Status: DC

## 2014-01-26 NOTE — Assessment & Plan Note (Signed)
68 y/o male with couple month history of nocturnal epigastric/hypogastric abdominal pain. No typical GERD. Remote EGD in 2004 as outlined. Started on PPI recently with no significant change in symptoms. Offered EGD for further evaluation, rule out PUD, gastritis, malignancy.  I have discussed the risks, alternatives, benefits with regards to but not limited to the risk of reaction to medication, bleeding, infection, perforation and the patient is agreeable to proceed. Written consent to be obtained.  Given chronic Xanax use, augment conscious sedation with Phenergan 12.5mg  IV 30 minutes before procedure.

## 2014-01-26 NOTE — Assessment & Plan Note (Signed)
Screening colonoscopy in the near future.  I have discussed the risks, alternatives, benefits with regards to but not limited to the risk of reaction to medication, bleeding, infection, perforation and the patient is agreeable to proceed. Written consent to be obtained.  Augment conscious sedation with phenergan 12.5mg  IV 30 minutes before due to chronic Xanax use.

## 2014-01-26 NOTE — Patient Instructions (Signed)
1. Colonoscopy and upper endoscopy with Dr. Rourk. See separate instructions. 

## 2014-01-26 NOTE — Progress Notes (Signed)
Primary Care Physician:  Leonides Grills, MD  Primary Gastroenterologist:  Garfield Cornea, MD   Chief Complaint  Patient presents with  . Gastrophageal Reflux    HPI:  John Melendez is a 68 y.o. male here for follow-up. He was seen on 10/27/2013 for consultation for initial screening colonoscopy. History of vague lower abdominal pain related to bowel movements. He was scheduled for colonoscopy but had to cancel it. He called in later with complaints of upper abdominal pain. He was started on Protonix. He takes Pepto-Bismol before bed.   Pain in hypogastric/epigastric region. Hurts more after lays down. Nocturnal symptoms only. No postprandial symptoms. 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 PPI helping very little. No dysphagia. EGD 2004, noncritical appearing Schatzki ring. Small hh. Denies any constipation, diarrhea, melena, rectal bleeding. No unintentional weight loss.  Magnesium one per day for cramps.  Current Outpatient Prescriptions  Medication Sig Dispense Refill  . ALPRAZolam (XANAX) 0.5 MG tablet Take 0.5 mg by mouth 3 (three) times daily as needed.    Marland Kitchen lisinopril (PRINIVIL,ZESTRIL) 10 MG tablet Take 10 mg by mouth daily.    Marland Kitchen loratadine (CLARITIN) 10 MG tablet Take 10 mg by mouth daily.    . pantoprazole (PROTONIX) 40 MG tablet Take 1 tablet (40 mg total) by mouth daily. 30 minutes before breakfast. 30 tablet 3  . Specialty Vitamins Products (MAGNESIUM, AMINO ACID CHELATE,) 133 MG tablet Take 1 tablet by mouth daily.     No current facility-administered medications for this visit.    Allergies as of 01/26/2014 - Review Complete 01/26/2014  Allergen Reaction Noted  . Penicillins Nausea Only 04/23/2011    Past Medical History  Diagnosis Date  . GERD (gastroesophageal reflux disease)     no PPI currently, symptoms controlled with diet and behavior  . Hypertension   . Anxiety     Past Surgical History  Procedure Laterality Date  . Back surgery    .  Cholecystectomy    . Esophagogastroduodenoscopy  2004    Dr. Gala Romney: non-critical Schatzki's ring s/p dilation with 30 F, small hiatal hernia, normal D1 and D2     Family History  Problem Relation Age of Onset  . Colon cancer Neg Hx     History   Social History  . Marital Status: Divorced    Spouse Name: N/A    Number of Children: 7  . Years of Education: N/A   Occupational History  . Not on file.   Social History Main Topics  . Smoking status: Never Smoker   . Smokeless tobacco: Not on file  . Alcohol Use: No  . Drug Use: No  . Sexual Activity: Not on file   Other Topics Concern  . Not on file   Social History Narrative      ROS:  General: Negative for anorexia, weight loss, fever, chills, fatigue, weakness. Eyes: Negative for vision changes.  ENT: Negative for hoarseness, difficulty swallowing , nasal congestion. CV: Negative for chest pain, angina, palpitations, dyspnea on exertion, peripheral edema.  Respiratory: Negative for dyspnea at rest, dyspnea on exertion, cough, sputum, wheezing.  GI: See history of present illness. GU:  Negative for dysuria, hematuria, urinary incontinence, urinary frequency, nocturnal urination.  MS: Negative for joint pain, low back pain.  Derm: Negative for rash or itching.  Neuro: Negative for weakness, abnormal sensation, seizure, frequent headaches, memory loss, confusion.  Psych: Negative for anxiety, depression, suicidal ideation, hallucinations.  Endo: Negative for unusual weight change.  Heme:  Negative for bruising or bleeding. Allergy: Negative for rash or hives.    Physical Examination:  BP 126/80 mmHg  Pulse 62  Temp(Src) 97.9 F (36.6 C) (Oral)  Ht 6\' 1"  (1.854 m)  Wt 206 lb (93.441 kg)  BMI 27.18 kg/m2   General: Well-nourished, well-developed in no acute distress.  Head: Normocephalic, atraumatic.   Eyes: Conjunctiva pink, no icterus. Mouth: Oropharyngeal mucosa moist and pink , no lesions erythema or  exudate. Neck: Supple without thyromegaly, masses, or lymphadenopathy.  Lungs: Clear to auscultation bilaterally.  Heart: Regular rate and rhythm, no murmurs rubs or gallops.  Abdomen: Bowel sounds are normal, nontender, nondistended, no hepatosplenomegaly or masses, no abdominal bruits or    hernia , no rebound or guarding.   Rectal: not performed Extremities: No lower extremity edema. No clubbing or deformities.  Neuro: Alert and oriented x 4 , grossly normal neurologically.  Skin: Warm and dry, no rash or jaundice.   Psych: Alert and cooperative, normal mood and affect.

## 2014-01-28 NOTE — Progress Notes (Signed)
cc'ed to pcp °

## 2014-02-14 ENCOUNTER — Other Ambulatory Visit (INDEPENDENT_AMBULATORY_CARE_PROVIDER_SITE_OTHER): Payer: Self-pay | Admitting: Internal Medicine

## 2014-02-14 ENCOUNTER — Telehealth (INDEPENDENT_AMBULATORY_CARE_PROVIDER_SITE_OTHER): Payer: Self-pay | Admitting: Internal Medicine

## 2014-02-14 MED ORDER — PEG 3350-KCL-NA BICARB-NACL 420 G PO SOLR: 4000.0000 mL | Freq: Once | ORAL | Status: DC

## 2014-02-14 NOTE — Telephone Encounter (Signed)
No prescription for Nulytely at [facility] sent to [facility] in [place] Patient is to have colonoscopy in a.m.

## 2014-02-15 ENCOUNTER — Encounter (HOSPITAL_COMMUNITY): Payer: Self-pay | Admitting: *Deleted

## 2014-02-15 ENCOUNTER — Encounter (HOSPITAL_COMMUNITY)
Admission: RE | Disposition: A | Discharge status: Home / Self Care | Payer: Self-pay | Source: Ambulatory Visit | Attending: Internal Medicine

## 2014-02-15 ENCOUNTER — Ambulatory Visit (HOSPITAL_COMMUNITY)
Admission: RE | Admit: 2014-02-15 | Discharge: 2014-02-15 | Disposition: A | Discharge status: Home / Self Care | Payer: Medicare Other | Source: Ambulatory Visit | Attending: Internal Medicine | Admitting: Internal Medicine

## 2014-02-15 DIAGNOSIS — Z9889 Other specified postprocedural states: Secondary | ICD-10-CM | POA: Diagnosis not present

## 2014-02-15 DIAGNOSIS — I10 Essential (primary) hypertension: Secondary | ICD-10-CM | POA: Diagnosis not present

## 2014-02-15 DIAGNOSIS — K648 Other hemorrhoids: Secondary | ICD-10-CM | POA: Insufficient documentation

## 2014-02-15 DIAGNOSIS — K635 Polyp of colon: Secondary | ICD-10-CM | POA: Diagnosis not present

## 2014-02-15 DIAGNOSIS — K5730 Diverticulosis of large intestine without perforation or abscess without bleeding: Secondary | ICD-10-CM | POA: Diagnosis not present

## 2014-02-15 DIAGNOSIS — K219 Gastro-esophageal reflux disease without esophagitis: Secondary | ICD-10-CM | POA: Insufficient documentation

## 2014-02-15 DIAGNOSIS — R1013 Epigastric pain: Secondary | ICD-10-CM | POA: Diagnosis present

## 2014-02-15 DIAGNOSIS — Z1211 Encounter for screening for malignant neoplasm of colon: Secondary | ICD-10-CM | POA: Diagnosis not present

## 2014-02-15 DIAGNOSIS — K449 Diaphragmatic hernia without obstruction or gangrene: Secondary | ICD-10-CM | POA: Diagnosis not present

## 2014-02-15 DIAGNOSIS — Z808 Family history of malignant neoplasm of other organs or systems: Secondary | ICD-10-CM | POA: Insufficient documentation

## 2014-02-15 DIAGNOSIS — Z86010 Personal history of colonic polyps: Secondary | ICD-10-CM | POA: Insufficient documentation

## 2014-02-15 DIAGNOSIS — D123 Benign neoplasm of transverse colon: Secondary | ICD-10-CM

## 2014-02-15 DIAGNOSIS — K222 Esophageal obstruction: Secondary | ICD-10-CM | POA: Diagnosis not present

## 2014-02-15 HISTORY — PX: COLONOSCOPY: SHX174

## 2014-02-15 HISTORY — PX: ESOPHAGOGASTRODUODENOSCOPY: SHX1529

## 2014-02-15 LAB — COMPREHENSIVE METABOLIC PANEL
ALBUMIN: 3.4 g/dL — AB (ref 3.5–5.2)
ALT: 30 U/L (ref 0–53)
AST: 23 U/L (ref 0–37)
Alkaline Phosphatase: 46 U/L (ref 39–117)
Anion gap: 9 (ref 5–15)
BUN: 11 mg/dL (ref 6–23)
CALCIUM: 8.4 mg/dL (ref 8.4–10.5)
CO2: 25 meq/L (ref 19–32)
CREATININE: 1.07 mg/dL (ref 0.50–1.35)
Chloride: 107 mEq/L (ref 96–112)
GFR calc Af Amer: 80 mL/min — ABNORMAL LOW (ref >90)
GFR calc non Af Amer: 69 mL/min — ABNORMAL LOW (ref >90)
Glucose, Bld: 90 mg/dL (ref 70–99)
Potassium: 4.5 mEq/L (ref 3.7–5.3)
Sodium: 141 mEq/L (ref 137–147)
TOTAL PROTEIN: 6 g/dL (ref 6.0–8.3)
Total Bilirubin: 0.5 mg/dL (ref 0.3–1.2)

## 2014-02-15 LAB — CBC WITH DIFFERENTIAL/PLATELET
BASOS ABS: 0 10*3/uL (ref 0.0–0.1)
Basophils Relative: 0 % (ref 0–1)
EOS ABS: 0.1 10*3/uL (ref 0.0–0.7)
EOS PCT: 2 % (ref 0–5)
HCT: 41.5 % (ref 39.0–52.0)
Hemoglobin: 14.3 g/dL (ref 13.0–17.0)
LYMPHS PCT: 38 % (ref 12–46)
Lymphs Abs: 1.8 10*3/uL (ref 0.7–4.0)
MCH: 32.2 pg (ref 26.0–34.0)
MCHC: 34.5 g/dL (ref 30.0–36.0)
MCV: 93.5 fL (ref 78.0–100.0)
Monocytes Absolute: 0.4 10*3/uL (ref 0.1–1.0)
Monocytes Relative: 9 % (ref 3–12)
Neutro Abs: 2.4 10*3/uL (ref 1.7–7.7)
Neutrophils Relative %: 51 % (ref 43–77)
PLATELETS: 192 10*3/uL (ref 150–400)
RBC: 4.44 MIL/uL (ref 4.22–5.81)
RDW: 11.9 % (ref 11.5–15.5)
WBC: 4.8 10*3/uL (ref 4.0–10.5)

## 2014-02-15 LAB — LIPASE, BLOOD: LIPASE: 32 U/L (ref 11–59)

## 2014-02-15 SURGERY — COLONOSCOPY
Anesthesia: Moderate Sedation

## 2014-02-15 MED ORDER — ONDANSETRON HCL 4 MG/2ML IJ SOLN
INTRAMUSCULAR | Status: AC
  Filled 2014-02-15: qty 2

## 2014-02-15 MED ORDER — STERILE WATER FOR IRRIGATION IR SOLN
Status: DC | PRN
  Administered 2014-02-15: 09:00:00

## 2014-02-15 MED ORDER — SODIUM CHLORIDE 0.9 % IJ SOLN
INTRAMUSCULAR | Status: AC
  Filled 2014-02-15: qty 10

## 2014-02-15 MED ORDER — PROMETHAZINE HCL 25 MG/ML IJ SOLN
12.5000 mg | Freq: Once | INTRAMUSCULAR | Status: AC
  Administered 2014-02-15: 12.5 mg via INTRAVENOUS

## 2014-02-15 MED ORDER — LIDOCAINE VISCOUS 2 % MT SOLN
OROMUCOSAL | Status: AC
  Filled 2014-02-15: qty 15

## 2014-02-15 MED ORDER — MEPERIDINE HCL 100 MG/ML IJ SOLN
INTRAMUSCULAR | Status: DC | PRN
  Administered 2014-02-15: 25 mg via INTRAVENOUS
  Administered 2014-02-15: 50 mg via INTRAVENOUS
  Administered 2014-02-15: 25 mg via INTRAVENOUS

## 2014-02-15 MED ORDER — MIDAZOLAM HCL 5 MG/5ML IJ SOLN
INTRAMUSCULAR | Status: DC | PRN
  Administered 2014-02-15 (×2): 1 mg via INTRAVENOUS
  Administered 2014-02-15: 2 mg via INTRAVENOUS
  Administered 2014-02-15: 1 mg via INTRAVENOUS
  Administered 2014-02-15: 2 mg via INTRAVENOUS

## 2014-02-15 MED ORDER — PROMETHAZINE HCL 25 MG/ML IJ SOLN
INTRAMUSCULAR | Status: AC
  Filled 2014-02-15: qty 1

## 2014-02-15 MED ORDER — ONDANSETRON HCL 4 MG/2ML IJ SOLN
INTRAMUSCULAR | Status: DC | PRN
  Administered 2014-02-15: 4 mg via INTRAVENOUS

## 2014-02-15 MED ORDER — LIDOCAINE VISCOUS 2 % MT SOLN
OROMUCOSAL | Status: DC | PRN
  Administered 2014-02-15: 3 mL via OROMUCOSAL

## 2014-02-15 MED ORDER — MIDAZOLAM HCL 5 MG/5ML IJ SOLN
INTRAMUSCULAR | Status: AC
  Filled 2014-02-15: qty 10

## 2014-02-15 MED ORDER — MEPERIDINE HCL 100 MG/ML IJ SOLN
INTRAMUSCULAR | Status: AC
  Filled 2014-02-15: qty 2

## 2014-02-15 MED ORDER — SODIUM CHLORIDE 0.9 % IV SOLN
INTRAVENOUS | Status: DC
  Administered 2014-02-15: 09:00:00 via INTRAVENOUS

## 2014-02-15 NOTE — Discharge Instructions (Signed)
Colonoscopy Discharge Instructions  Read the instructions outlined below and refer to this sheet in the next few weeks. These discharge instructions provide you with general information on caring for yourself after you leave the hospital. Your doctor may also give you specific instructions. While your treatment has been planned according to the most current medical practices available, unavoidable complications occasionally occur. If you have any problems or questions after discharge, call Dr. Gala Romney at [phone]. ACTIVITY  You may resume your regular activity, but move at a slower pace for the next 24 hours.   Take frequent rest periods for the next 24 hours.   Walking will help get rid of the air and reduce the bloated feeling in your belly (abdomen).   No driving for 24 hours (because of the medicine (anesthesia) used during the test).    Do not sign any important legal documents or operate any machinery for 24 hours (because of the anesthesia used during the test).  NUTRITION  Drink plenty of fluids.   You may resume your normal diet as instructed by your doctor.   Begin with a light meal and progress to your normal diet. Heavy or fried foods are harder to digest and may make you feel sick to your stomach (nauseated).   Avoid alcoholic beverages for 24 hours or as instructed.  MEDICATIONS  You may resume your normal medications unless your doctor tells you otherwise.  WHAT YOU CAN EXPECT TODAY  Some feelings of bloating in the abdomen.   Passage of more gas than usual.   Spotting of blood in your stool or on the toilet paper.  IF YOU HAD POLYPS REMOVED DURING THE COLONOSCOPY:  No aspirin products for 7 days or as instructed.   No alcohol for 7 days or as instructed.   Eat a soft diet for the next 24 hours.  FINDING OUT THE RESULTS OF YOUR TEST Not all test results are available during your visit. If your test results are not back during the visit, make an appointment  with your caregiver to find out the results. Do not assume everything is normal if you have not heard from your caregiver or the medical facility. It is important for you to follow up on all of your test results.  SEEK IMMEDIATE MEDICAL ATTENTION IF:  You have more than a spotting of blood in your stool.   Your belly is swollen (abdominal distention).   You are nauseated or vomiting.   You have a temperature over 101.  You have abdominal pain or discomfort that is severe or gets worse throughout the day.   Colon Polyps Polyps are lumps of extra tissue growing inside the body. Polyps can grow in the large intestine (colon). Most colon polyps are noncancerous (benign). However, some colon polyps can become cancerous over time. Polyps that are larger than a pea may be harmful. To be safe, caregivers remove and test all polyps. CAUSES  Polyps form when mutations in the genes cause your cells to grow and divide even though no more tissue is needed. RISK FACTORS There are a number of risk factors that can increase your chances of getting colon polyps. They include: Being older than 50 years. Family history of colon polyps or colon cancer. Long-term colon diseases, such as colitis or Crohn disease. Being overweight. Smoking. Being inactive. Drinking too much alcohol. SYMPTOMS  Most small polyps do not cause symptoms. If symptoms are present, they may include: Blood in the stool. The stool may look  dark red or black. Constipation or diarrhea that lasts longer than 1 week. DIAGNOSIS People often do not know they have polyps until their caregiver finds them during a regular checkup. Your caregiver can use 4 tests to check for polyps: Digital rectal exam. The caregiver wears gloves and feels inside the rectum. This test would find polyps only in the rectum. Barium enema. The caregiver puts a liquid called barium into your rectum before taking X-rays of your colon. Barium makes your colon look  white. Polyps are dark, so they are easy to see in the X-ray pictures. Sigmoidoscopy. A thin, flexible tube (sigmoidoscope) is placed into your rectum. The sigmoidoscope has a light and tiny camera in it. The caregiver uses the sigmoidoscope to look at the last third of your colon. Colonoscopy. This test is like sigmoidoscopy, but the caregiver looks at the entire colon. This is the most common method for finding and removing polyps. TREATMENT  Any polyps will be removed during a sigmoidoscopy or colonoscopy. The polyps are then tested for cancer. PREVENTION  To help lower your risk of getting more colon polyps: Eat plenty of fruits and vegetables. Avoid eating fatty foods. Do not smoke. Avoid drinking alcohol. Exercise every day. Lose weight if recommended by your caregiver. Eat plenty of calcium and folate. Foods that are rich in calcium include milk, cheese, and broccoli. Foods that are rich in folate include chickpeas, kidney beans, and spinach. HOME CARE INSTRUCTIONS Keep all follow-up appointments as directed by your caregiver. You may need periodic exams to check for polyps. SEEK MEDICAL CARE IF: You notice bleeding during a bowel movement. Document Released: 11/23/2003 Document Revised: 05/21/2011 Document Reviewed: 05/08/2011 [facility] Patient Information 2015 [place], [place]. This information is not intended to replace advice given to you by your health care provider. Make sure you discuss any questions you have with your health care provider. EGD Discharge instructions Please read the instructions outlined below and refer to this sheet in the next few weeks. These discharge instructions provide you with general information on caring for yourself after you leave the hospital. Your doctor may also give you specific instructions. While your treatment has been planned according to the most current medical practices available, unavoidable complications occasionally occur. If you have any  problems or questions after discharge, please call your doctor. ACTIVITY You may resume your regular activity but move at a slower pace for the next 24 hours.  Take frequent rest periods for the next 24 hours.  Walking will help expel (get rid of) the air and reduce the bloated feeling in your abdomen.  No driving for 24 hours (because of the anesthesia (medicine) used during the test).  You may shower.  Do not sign any important legal documents or operate any machinery for 24 hours (because of the anesthesia used during the test).  NUTRITION Drink plenty of fluids.  You may resume your normal diet.  Begin with a light meal and progress to your normal diet.  Avoid alcoholic beverages for 24 hours or as instructed by your caregiver.  MEDICATIONS You may resume your normal medications unless your caregiver tells you otherwise.  WHAT YOU CAN EXPECT TODAY You may experience abdominal discomfort such as a feeling of fullness or gas pains.  FOLLOW-UP Your doctor will discuss the results of your test with you.  SEEK IMMEDIATE MEDICAL ATTENTION IF ANY OF THE FOLLOWING OCCUR: Excessive nausea (feeling sick to your stomach) and/or vomiting.  Severe abdominal pain and distention (swelling).  Trouble  swallowing.  Temperature over 101 F (37.8 C).  Rectal bleeding or vomiting of blood.  Diverticulosis Diverticulosis is the condition that develops when small pouches (diverticula) form in the wall of your colon. Your colon, or large intestine, is where water is absorbed and stool is formed. The pouches form when the inside layer of your colon pushes through weak spots in the outer layers of your colon. CAUSES  No one knows exactly what causes diverticulosis. RISK FACTORS Being older than 44. Your risk for this condition increases with age. Diverticulosis is rare in people younger than 40 years. By age 107, almost everyone has it. Eating a low-fiber diet. Being frequently constipated. Being  overweight. Not getting enough exercise. Smoking. Taking over-the-counter pain medicines, like aspirin and ibuprofen. SYMPTOMS  Most people with diverticulosis do not have symptoms. DIAGNOSIS  Because diverticulosis often has no symptoms, health care providers often discover the condition during an exam for other colon problems. In many cases, a health care provider will diagnose diverticulosis while using a flexible scope to examine the colon (colonoscopy). TREATMENT  If you have never developed an infection related to diverticulosis, you may not need treatment. If you have had an infection before, treatment may include: Eating more fruits, vegetables, and grains. Taking a fiber supplement. Taking a live bacteria supplement (probiotic). Taking medicine to relax your colon. HOME CARE INSTRUCTIONS  Drink at least 6-8 glasses of water each day to prevent constipation. Try not to strain when you have a bowel movement. Keep all follow-up appointments. If you have had an infection before: Increase the fiber in your diet as directed by your health care provider or dietitian. Take a dietary fiber supplement if your health care provider approves. Only take medicines as directed by your health care provider. SEEK MEDICAL CARE IF:  You have abdominal pain. You have bloating. You have cramps. You have not gone to the bathroom in 3 days. SEEK IMMEDIATE MEDICAL CARE IF:  Your pain gets worse. Yourbloating becomes very bad. You have a fever or chills, and your symptoms suddenly get worse. You begin vomiting. You have bowel movements that are bloody or black. MAKE SURE YOU: Understand these instructions. Will watch your condition. Will get help right away if you are not doing well or get worse. Document Released: 11/24/2003 Document Revised: 03/03/2013 Document Reviewed: 01/21/2013 [facility] Patient Information 2015 [place], [place]. This information is not intended to replace advice given to  you by your health care provider. Make sure you discuss any questions you have with your health care provider.    Polyp and diverticulosis information provided  Contrast CT of the abdomen and pelvis-epigastric pain. Extrinsic compression on the stomach seen on EGD  Further recommendations to follow pending review of pathology report  CBC, Chem-12 and serum lipase today

## 2014-02-15 NOTE — H&P (View-Only) (Signed)
Primary Care Physician:  Leonides Grills, MD  Primary Gastroenterologist:  Garfield Cornea, MD   Chief Complaint  Patient presents with  . Gastrophageal Reflux    HPI:  John Melendez is a 68 y.o. male here for follow-up. He was seen on 10/27/2013 for consultation for initial screening colonoscopy. History of vague lower abdominal pain related to bowel movements. He was scheduled for colonoscopy but had to cancel it. He called in later with complaints of upper abdominal pain. He was started on Protonix. He takes Pepto-Bismol before bed.   Pain in hypogastric/epigastric region. Hurts more after lays down. Nocturnal symptoms only. No postprandial symptoms. 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 PPI helping very little. No dysphagia. EGD 2004, noncritical appearing Schatzki ring. Small hh. Denies any constipation, diarrhea, melena, rectal bleeding. No unintentional weight loss.  Magnesium one per day for cramps.  Current Outpatient Prescriptions  Medication Sig Dispense Refill  . ALPRAZolam (XANAX) 0.5 MG tablet Take 0.5 mg by mouth 3 (three) times daily as needed.    Marland Kitchen lisinopril (PRINIVIL,ZESTRIL) 10 MG tablet Take 10 mg by mouth daily.    Marland Kitchen loratadine (CLARITIN) 10 MG tablet Take 10 mg by mouth daily.    . pantoprazole (PROTONIX) 40 MG tablet Take 1 tablet (40 mg total) by mouth daily. 30 minutes before breakfast. 30 tablet 3  . Specialty Vitamins Products (MAGNESIUM, AMINO ACID CHELATE,) 133 MG tablet Take 1 tablet by mouth daily.     No current facility-administered medications for this visit.    Allergies as of 01/26/2014 - Review Complete 01/26/2014  Allergen Reaction Noted  . Penicillins Nausea Only 04/23/2011    Past Medical History  Diagnosis Date  . GERD (gastroesophageal reflux disease)     no PPI currently, symptoms controlled with diet and behavior  . Hypertension   . Anxiety     Past Surgical History  Procedure Laterality Date  . Back surgery    .  Cholecystectomy    . Esophagogastroduodenoscopy  2004    Dr. Gala Romney: non-critical Schatzki's ring s/p dilation with 33 F, small hiatal hernia, normal D1 and D2     Family History  Problem Relation Age of Onset  . Colon cancer Neg Hx     History   Social History  . Marital Status: Divorced    Spouse Name: N/A    Number of Children: 7  . Years of Education: N/A   Occupational History  . Not on file.   Social History Main Topics  . Smoking status: Never Smoker   . Smokeless tobacco: Not on file  . Alcohol Use: No  . Drug Use: No  . Sexual Activity: Not on file   Other Topics Concern  . Not on file   Social History Narrative      ROS:  General: Negative for anorexia, weight loss, fever, chills, fatigue, weakness. Eyes: Negative for vision changes.  ENT: Negative for hoarseness, difficulty swallowing , nasal congestion. CV: Negative for chest pain, angina, palpitations, dyspnea on exertion, peripheral edema.  Respiratory: Negative for dyspnea at rest, dyspnea on exertion, cough, sputum, wheezing.  GI: See history of present illness. GU:  Negative for dysuria, hematuria, urinary incontinence, urinary frequency, nocturnal urination.  MS: Negative for joint pain, low back pain.  Derm: Negative for rash or itching.  Neuro: Negative for weakness, abnormal sensation, seizure, frequent headaches, memory loss, confusion.  Psych: Negative for anxiety, depression, suicidal ideation, hallucinations.  Endo: Negative for unusual weight change.  Heme:  Negative for bruising or bleeding. Allergy: Negative for rash or hives.    Physical Examination:  BP 126/80 mmHg  Pulse 62  Temp(Src) 97.9 F (36.6 C) (Oral)  Ht 6\' 1"  (1.854 m)  Wt 206 lb (93.441 kg)  BMI 27.18 kg/m2   General: Well-nourished, well-developed in no acute distress.  Head: Normocephalic, atraumatic.   Eyes: Conjunctiva pink, no icterus. Mouth: Oropharyngeal mucosa moist and pink , no lesions erythema or  exudate. Neck: Supple without thyromegaly, masses, or lymphadenopathy.  Lungs: Clear to auscultation bilaterally.  Heart: Regular rate and rhythm, no murmurs rubs or gallops.  Abdomen: Bowel sounds are normal, nontender, nondistended, no hepatosplenomegaly or masses, no abdominal bruits or    hernia , no rebound or guarding.   Rectal: not performed Extremities: No lower extremity edema. No clubbing or deformities.  Neuro: Alert and oriented x 4 , grossly normal neurologically.  Skin: Warm and dry, no rash or jaundice.   Psych: Alert and cooperative, normal mood and affect.

## 2014-02-15 NOTE — Op Note (Signed)
[place], [id number]   ENDOSCOPY PROCEDURE REPORT  PATIENT: John, Melendez  MR#: [id number] BIRTHDATE: September 16, 1945 , 62  yrs. old GENDER: male ENDOSCOPIST: R.  Garfield Cornea, MD FACP FACG REFERRED BY:  Elsie Lincoln, M.D. PROCEDURE DATE:  2014/03/01 PROCEDURE:  EGD, diagnostic INDICATIONS:  epigastric pain. MEDICATIONS: Versed 6 mg IV and Demerol 100 mg IV in divided doses. Xylocaine gel orally.  Phenergan 12.5 mg IV.  Zofran 4 mg IV ASA CLASS:      Class II  CONSENT: The risks, benefits, limitations, alternatives and imponderables have been discussed.  The potential for biopsy, esophogeal dilation, etc. have also been reviewed.  Questions have been answered.  All parties agreeable.  Please see the history and physical in the medical record for more information.  DESCRIPTION OF PROCEDURE: After the risks benefits and alternatives of the procedure were thoroughly explained, informed consent was obtained.  The EG-[id number] ([id number]) endoscope was introduced through the mouth and advanced to the second portion of the duodenum , limited by Without limitations. The instrument was slowly withdrawn as the mucosa was fully examined.    Noncritical Schatzki's ring; otherwise normal esophagus.  Stomach empty.  Small hiatal hernia.  (2)3-4 centimeter areas of extrinsic compression on the anterior gastric wall.  Please see photos.  The gastric mucosa appeared entirely normal otherwise.  Pylorus patent. The first, second third portion of the duodenum appeared normal as well.  Retroflexed views revealed as previously described.     The scope was then withdrawn from the patient and the procedure completed.  COMPLICATIONS: There were no immediate complications.  ENDOSCOPIC IMPRESSION: Noncritical Schatzki's ring?"not manipulated/no dysphagia. Hiatal hernia. Extrinsic gastric compression of uncertain significance.  RECOMMENDATIONS: Anticipate CT  scan of the abdomen and pelvis in the near future. See colonoscopy report.  REPEAT EXAM:  eSigned:  R. Garfield Cornea, MD Rosalita Chessman [facility] 03/01/2014 9:54 AM    CC:  CPT CODES: ICD CODES:  The ICD and CPT codes recommended by this software are interpretations from the data that the clinical staff has captured with the software.  The verification of the translation of this report to the ICD and CPT codes and modifiers is the sole responsibility of the health care institution and practicing physician where this report was generated.  [employer] will not be held responsible for the validity of the ICD and CPT codes included on this report.  AMA assumes no liability for data contained or not contained herein. CPT is a Designer, television/film set of the [employer].  PATIENT NAME:  John, Melendez MR#: [id number]

## 2014-02-15 NOTE — Interval H&P Note (Signed)
History and Physical Interval Note:  02/15/2014 9:22 AM  John Melendez  has presented today for surgery, with the diagnosis of Epigastric Pain/ Screening Colonoscopy  The various methods of treatment have been discussed with the patient and family. After consideration of risks, benefits and other options for treatment, the patient has consented to  Procedure(s) with comments: COLONOSCOPY (N/A) - 9:30 am ESOPHAGOGASTRODUODENOSCOPY (EGD) (N/A) as a surgical intervention .  The patient's history has been reviewed, patient examined, no change in status, stable for surgery.  I have reviewed the patient's chart and labs.  Questions were answered to the patient's satisfaction.     John Melendez  No change. EGD colonoscopy per plan.The risks, benefits, limitations, imponderables and alternatives regarding both EGD and colonoscopy have been reviewed with the patient. Questions have been answered. All parties agreeable.

## 2014-02-15 NOTE — Op Note (Signed)
[facility], [id number]   COLONOSCOPY PROCEDURE REPORT  PATIENT: John Melendez, John Melendez  MR#: [id number] BIRTHDATE: Oct 02, 1945 , 71  yrs. old GENDER: male ENDOSCOPIST: R.  Garfield Cornea, MD FACP [facility] REFERRED LE:XNTZGYF Orson Ape, M.D. PROCEDURE DATE:  02/15/2014 PROCEDURE:   Ileocolonoscopyy with snare polypectomy INDICATIONS:First ever average risk colorectal cancer screening examination. MEDICATIONS: Versed 7 mg IV and Demerol 100 mg IV in divided doses. Zofran 4 mg IV.  Phenergan 12.5 mg IV. ASA CLASS:       Class II  CONSENT: The risks, benefits, alternatives and imponderables including but not limited to bleeding, perforation as well as the possibility of a missed lesion have been reviewed.  The potential for biopsy, lesion removal, etc. have also been discussed. Questions have been answered.  All parties agreeable.  Please see the history and physical in the medical record for more information.  DESCRIPTION OF PROCEDURE:   After the risks benefits and alternatives of the procedure were thoroughly explained, informed consent was obtained.  The digital rectal exam revealed no abnormalities of the rectum.   The EC-[id number] ([id number])  endoscope was introduced through the anus and advanced to the terminal ileum which was intubated for a short distance. No adverse events experienced.   The quality of the prep was adequate.  The instrument was then slowly withdrawn as the colon was fully examined.      COLON FINDINGS: Anal papilla and internal hemorrhoids; otherwise normal rectum.  Scattered narrow mouth left-sided diverticula; (1) 5 mm transverse colon polyp and (1) 5 mm sigmoid polyp; otherwise, the remainder of the colonic mucosa appeared normal.  The distal 5 cm of terminal ileum mucosa also appeared normal.  Retroflexed views revealed no abnormalities. .  Withdrawal time=13 minutes 0 seconds.  The scope was withdrawn and the procedure  completed. COMPLICATIONS: There were no immediate complications.  ENDOSCOPIC IMPRESSION: Anal papilla/internal hemorrhoids. Colonic diverticulosis. Multiple colonic polyps?"removed as described above.  RECOMMENDATIONS: Follow-up pathology. See EGD report. Further recommendations to follow.  eSigned:  R. Garfield Cornea, MD Rosalita Chessman [facility] 2014-02-15 10:18:20.960   cc:  CPT CODES: ICD CODES:  The ICD and CPT codes recommended by this software are interpretations from the data that the clinical staff has captured with the software.  The verification of the translation of this report to the ICD and CPT codes and modifiers is the sole responsibility of the health care institution and practicing physician where this report was generated.  [employer] will not be held responsible for the validity of the ICD and CPT codes included on this report.  AMA assumes no liability for data contained or not contained herein. CPT is a Designer, television/film set of the [employer].  PATIENT NAME:  John Melendez, John Melendez MR#: [id number]

## 2014-02-16 ENCOUNTER — Encounter: Payer: Self-pay | Admitting: Internal Medicine

## 2014-02-17 ENCOUNTER — Other Ambulatory Visit: Payer: Self-pay

## 2014-02-17 DIAGNOSIS — R1013 Epigastric pain: Secondary | ICD-10-CM

## 2014-02-17 NOTE — Telephone Encounter (Signed)
Open in error

## 2014-02-24 ENCOUNTER — Ambulatory Visit (HOSPITAL_COMMUNITY)
Admission: RE | Admit: 2014-02-24 | Discharge: 2014-02-24 | Disposition: A | Discharge status: Home / Self Care | Payer: Medicare Other | Source: Ambulatory Visit | Attending: Internal Medicine | Admitting: Internal Medicine

## 2014-02-24 DIAGNOSIS — R1013 Epigastric pain: Secondary | ICD-10-CM

## 2014-02-24 DIAGNOSIS — N200 Calculus of kidney: Secondary | ICD-10-CM | POA: Diagnosis not present

## 2014-02-24 DIAGNOSIS — Z9049 Acquired absence of other specified parts of digestive tract: Secondary | ICD-10-CM | POA: Insufficient documentation

## 2014-02-24 DIAGNOSIS — R109 Unspecified abdominal pain: Secondary | ICD-10-CM | POA: Diagnosis present

## 2014-02-24 DIAGNOSIS — I700 Atherosclerosis of aorta: Secondary | ICD-10-CM | POA: Insufficient documentation

## 2014-02-24 DIAGNOSIS — N400 Enlarged prostate without lower urinary tract symptoms: Secondary | ICD-10-CM | POA: Insufficient documentation

## 2014-02-24 DIAGNOSIS — M47896 Other spondylosis, lumbar region: Secondary | ICD-10-CM | POA: Diagnosis not present

## 2014-02-24 DIAGNOSIS — K449 Diaphragmatic hernia without obstruction or gangrene: Secondary | ICD-10-CM | POA: Diagnosis not present

## 2014-02-24 MED ORDER — IOHEXOL 300 MG/ML  SOLN
100.0000 mL | Freq: Once | INTRAMUSCULAR | Status: AC | PRN
  Administered 2014-02-24: 100 mL via INTRAVENOUS

## 2014-03-01 ENCOUNTER — Telehealth: Payer: Self-pay | Admitting: Internal Medicine

## 2014-03-01 ENCOUNTER — Encounter: Payer: Self-pay | Admitting: Internal Medicine

## 2014-03-01 NOTE — Telephone Encounter (Signed)
I spoke with the patient and gave him his results from the CT. He said he continues to have the abd pain at night. It only happens after he has been asleep for 4-5 hours, he will wake up with pain and after he walks around awhile, the pain will go away. This happens every night. No N/V, no diarrhea/constipation, no fever. Last BM was this morning and it was normal. He wants to know if RMR may think it has anything to do with his hiatal hernia?

## 2014-03-01 NOTE — Telephone Encounter (Signed)
PLEASE CALL PATIENT REGARDING CT SCAN FROM LAST WEEK  [phone]

## 2014-03-01 NOTE — Progress Notes (Signed)
APPT MADE AND LETTER SENT  °

## 2014-03-02 NOTE — Telephone Encounter (Signed)
Erline Levine, please get him an asap appt.

## 2014-03-02 NOTE — Telephone Encounter (Signed)
We have covered a great deal of ground with his recent endoscopic and CT evaluation. He continues to have symptoms. I believe the best thing would be to bring him back in the office in the next couple of weeks to see the extender

## 2014-03-03 NOTE — Telephone Encounter (Signed)
PATIENT COMING IN URGENT SLOT AND IS AWARE OF NEW DATE AND TIME

## 2014-03-09 ENCOUNTER — Telehealth: Payer: Self-pay | Admitting: Internal Medicine

## 2014-03-09 NOTE — Telephone Encounter (Signed)
PLEASE CALL PATIENT REGARDING HIS APPOINTMENT, HE IS INQUIRING ABOUT WHY HE IS COMING IN AGAIN,

## 2014-03-10 ENCOUNTER — Encounter: Payer: Self-pay | Admitting: Internal Medicine

## 2014-03-10 ENCOUNTER — Ambulatory Visit: Payer: Medicare Other | Admitting: Gastroenterology

## 2014-03-10 NOTE — Telephone Encounter (Signed)
I understand his situation. However, having him return to the office to be reevaluated would be the best practice and  in his best interest.

## 2014-03-10 NOTE — Telephone Encounter (Signed)
John Melendez, please cancel ov for today and reschedule.  Thanks.

## 2014-03-10 NOTE — Telephone Encounter (Signed)
I spoke with the pt. He has an appt for today but he doesn't have the $50 to come in and he said he is still having the same problem and he doesn't understand why he needs to come back in again. He said if there is any other testing that RMR wants him to do he will do it but he doesn't want to come back into the office right now. He said he is on a fixed income, even billing the copay isnt an option for him. He also said that he was getting a little better now.   Dr.Rourk- do you want to order anything else? Or do you want him to just reschedule his ov?

## 2014-03-10 NOTE — Telephone Encounter (Signed)
NEW APPOINTMENT MADE AND LETTER SENT

## 2014-04-12 ENCOUNTER — Ambulatory Visit: Payer: Medicare Other | Admitting: Gastroenterology

## 2014-04-26 ENCOUNTER — Ambulatory Visit: Payer: Medicare Other | Admitting: Gastroenterology

## 2014-04-27 ENCOUNTER — Encounter: Payer: Self-pay | Admitting: Gastroenterology

## 2014-05-04 ENCOUNTER — Telehealth: Payer: Self-pay

## 2014-05-04 MED ORDER — PANTOPRAZOLE SODIUM 40 MG PO TBEC: 40.0000 mg | DELAYED_RELEASE_TABLET | Freq: Every day | ORAL | Status: DC

## 2014-05-04 NOTE — Telephone Encounter (Signed)
done

## 2014-05-04 NOTE — Telephone Encounter (Signed)
Pt called- he is out of his pantoprazole. He said [facility] sent in refill request yesterday, there is nothing in his chart. I informed him that I would send a note to the refill box.

## 2014-05-10 DIAGNOSIS — B353 Tinea pedis: Secondary | ICD-10-CM | POA: Diagnosis not present

## 2014-05-10 DIAGNOSIS — Z6826 Body mass index (BMI) 26.0-26.9, adult: Secondary | ICD-10-CM | POA: Diagnosis not present

## 2014-05-10 DIAGNOSIS — E663 Overweight: Secondary | ICD-10-CM | POA: Diagnosis not present

## 2014-05-14 ENCOUNTER — Ambulatory Visit: Payer: Medicare Other | Admitting: Gastroenterology

## 2014-05-17 DIAGNOSIS — H409 Unspecified glaucoma: Secondary | ICD-10-CM | POA: Diagnosis not present

## 2014-05-24 DIAGNOSIS — B351 Tinea unguium: Secondary | ICD-10-CM | POA: Diagnosis not present

## 2014-05-24 DIAGNOSIS — Z6826 Body mass index (BMI) 26.0-26.9, adult: Secondary | ICD-10-CM | POA: Diagnosis not present

## 2014-06-10 ENCOUNTER — Other Ambulatory Visit: Payer: Self-pay

## 2014-06-15 MED ORDER — PANTOPRAZOLE SODIUM 40 MG PO TBEC: 40.0000 mg | DELAYED_RELEASE_TABLET | Freq: Every day | ORAL | Status: DC

## 2014-06-16 ENCOUNTER — Other Ambulatory Visit: Payer: Self-pay | Admitting: Gastroenterology

## 2014-09-07 DIAGNOSIS — J069 Acute upper respiratory infection, unspecified: Secondary | ICD-10-CM | POA: Diagnosis not present

## 2014-09-15 DIAGNOSIS — M79671 Pain in right foot: Secondary | ICD-10-CM | POA: Diagnosis not present

## 2014-09-15 DIAGNOSIS — M2041 Other hammer toe(s) (acquired), right foot: Secondary | ICD-10-CM | POA: Diagnosis not present

## 2014-09-15 DIAGNOSIS — M79675 Pain in left toe(s): Secondary | ICD-10-CM | POA: Diagnosis not present

## 2014-09-15 DIAGNOSIS — M25774 Osteophyte, right foot: Secondary | ICD-10-CM | POA: Diagnosis not present

## 2014-10-19 DIAGNOSIS — R1033 Periumbilical pain: Secondary | ICD-10-CM | POA: Diagnosis not present

## 2014-10-19 DIAGNOSIS — R160 Hepatomegaly, not elsewhere classified: Secondary | ICD-10-CM | POA: Diagnosis not present

## 2014-10-20 DIAGNOSIS — R160 Hepatomegaly, not elsewhere classified: Secondary | ICD-10-CM | POA: Diagnosis not present

## 2014-10-20 DIAGNOSIS — R1033 Periumbilical pain: Secondary | ICD-10-CM | POA: Diagnosis not present

## 2014-11-05 DIAGNOSIS — Z1389 Encounter for screening for other disorder: Secondary | ICD-10-CM | POA: Diagnosis not present

## 2014-11-05 DIAGNOSIS — Z0000 Encounter for general adult medical examination without abnormal findings: Secondary | ICD-10-CM | POA: Diagnosis not present

## 2015-01-26 DIAGNOSIS — R1033 Periumbilical pain: Secondary | ICD-10-CM | POA: Diagnosis not present

## 2015-02-10 DIAGNOSIS — L723 Sebaceous cyst: Secondary | ICD-10-CM | POA: Diagnosis not present

## 2015-02-10 DIAGNOSIS — Z0000 Encounter for general adult medical examination without abnormal findings: Secondary | ICD-10-CM | POA: Diagnosis not present

## 2015-02-10 DIAGNOSIS — Z1389 Encounter for screening for other disorder: Secondary | ICD-10-CM | POA: Diagnosis not present

## 2015-02-10 DIAGNOSIS — Z23 Encounter for immunization: Secondary | ICD-10-CM | POA: Diagnosis not present

## 2015-03-18 DIAGNOSIS — H6990 Unspecified Eustachian tube disorder, unspecified ear: Secondary | ICD-10-CM | POA: Diagnosis not present

## 2015-03-18 DIAGNOSIS — J019 Acute sinusitis, unspecified: Secondary | ICD-10-CM | POA: Diagnosis not present

## 2015-03-18 DIAGNOSIS — Z1389 Encounter for screening for other disorder: Secondary | ICD-10-CM | POA: Diagnosis not present

## 2015-05-03 DIAGNOSIS — H25013 Cortical age-related cataract, bilateral: Secondary | ICD-10-CM | POA: Diagnosis not present

## 2015-05-03 DIAGNOSIS — H401112 Primary open-angle glaucoma, right eye, moderate stage: Secondary | ICD-10-CM | POA: Diagnosis not present

## 2015-05-03 DIAGNOSIS — H401123 Primary open-angle glaucoma, left eye, severe stage: Secondary | ICD-10-CM | POA: Diagnosis not present

## 2015-05-09 DIAGNOSIS — H401112 Primary open-angle glaucoma, right eye, moderate stage: Secondary | ICD-10-CM | POA: Diagnosis not present

## 2015-05-16 DIAGNOSIS — H401112 Primary open-angle glaucoma, right eye, moderate stage: Secondary | ICD-10-CM | POA: Diagnosis not present

## 2015-05-16 DIAGNOSIS — H401123 Primary open-angle glaucoma, left eye, severe stage: Secondary | ICD-10-CM | POA: Diagnosis not present

## 2015-06-13 ENCOUNTER — Ambulatory Visit (HOSPITAL_COMMUNITY)
Admission: RE | Admit: 2015-06-13 | Discharge: 2015-06-13 | Disposition: A | Discharge status: Home / Self Care | Payer: Medicare Other | Source: Ambulatory Visit | Attending: Ophthalmology | Admitting: Ophthalmology

## 2015-06-13 ENCOUNTER — Encounter (HOSPITAL_COMMUNITY)
Admission: RE | Disposition: A | Discharge status: Home / Self Care | Payer: Self-pay | Source: Ambulatory Visit | Attending: Ophthalmology

## 2015-06-13 ENCOUNTER — Encounter (HOSPITAL_COMMUNITY): Payer: Self-pay

## 2015-06-13 DIAGNOSIS — H4089 Other specified glaucoma: Secondary | ICD-10-CM | POA: Insufficient documentation

## 2015-06-13 DIAGNOSIS — H401112 Primary open-angle glaucoma, right eye, moderate stage: Secondary | ICD-10-CM | POA: Diagnosis not present

## 2015-06-13 HISTORY — PX: SLT LASER APPLICATION: SHX6099

## 2015-06-13 SURGERY — SLT LASER APPLICATION
Anesthesia: LOCAL | Laterality: Right

## 2015-06-13 MED ORDER — PILOCARPINE HCL 1 % OP SOLN
OPHTHALMIC | Status: AC
  Filled 2015-06-13: qty 15

## 2015-06-13 MED ORDER — TETRACAINE HCL 0.5 % OP SOLN
1.0000 [drp] | OPHTHALMIC | Status: AC
  Administered 2015-06-13: 1 [drp] via OPHTHALMIC

## 2015-06-13 MED ORDER — TETRACAINE HCL 0.5 % OP SOLN
OPHTHALMIC | Status: AC
  Filled 2015-06-13: qty 4

## 2015-06-13 MED ORDER — APRACLONIDINE HCL 1 % OP SOLN
1.0000 [drp] | Freq: Once | OPHTHALMIC | Status: AC
Start: 2015-06-13 — End: 2015-06-13
  Administered 2015-06-13: 1 [drp] via OPHTHALMIC

## 2015-06-13 MED ORDER — APRACLONIDINE HCL 1 % OP SOLN
OPHTHALMIC | Status: AC
  Filled 2015-06-13: qty 0.1

## 2015-06-13 MED ORDER — PILOCARPINE HCL 1 % OP SOLN
2.0000 [drp] | Freq: Once | OPHTHALMIC | Status: AC
  Administered 2015-06-13: 2 [drp] via OPHTHALMIC

## 2015-06-13 NOTE — Brief Op Note (Signed)
John Melendez 06/13/2015  Williams Che, MD  Pre-op Diagnosis:  uncontrolled glaucoma OD  Post-op Diagnosis:   same  Yag laser self-test completed: Yes.    Indications:    See scanned office H&P for detailed indications  Procedure: SLT  right eye  Eye protection worn by staff:  Yes.   Laser In Use sign on door:  Yes.    Laser:  {LUMENIS YAG/SLT LASER  Selecta Duet  Power Setting:  0.9 mJ/burst Anatomical site treated:  Trabecular meshwork OD Number of applications:  93 Total energy delivered: 78.38  mJ Results:  Post procedure IOP 19 mmHg  The patient was discharged home with instructions to continue all his current glaucoma medications in the un-operated eye, and discontinue all his current glaucoma medications, if any.  Patient was instructed to go to the office, as previously scheduled, for intraocular pressure:  Yes.    Patient verbalizes understanding of discharge instructions:  Yes.    Notes:  Gomoscopy:  Grade:   4+ Open    Pigmentation:  Minimal    Synechiae:  none    Angle ressessions : none    Other:  Pt tolerated procedure well without complication

## 2015-06-13 NOTE — H&P (Signed)
I have reviewed the pre printed H&P, the patient was re-examined, and I have identified no significant interval changes in the patient's medical condition.  There is no change in the plan of care since the history and physical of record. 

## 2015-06-13 NOTE — Progress Notes (Signed)
EYE PRESSURE 19

## 2015-06-13 NOTE — Discharge Instructions (Signed)
John Melendez  06/13/2015     Instructions    Activity: No Restrictions.   Diet: Resume Diet you were on at home.   Pain Medication: Tylenol if Needed.   CONTACT YOUR DOCTOR IF YOU HAVE PAIN, REDNESS IN YOUR EYE, OR DECREASED VISION.   Follow-up:in 3 weeks with Williams Che, MD.   Dr. Gershon Crane: [phone]  Dr. Iona HansenQN:[id number]  Dr. Geoffry ParadiseBB:[id number]   If you find that you cannot contact your physician, but feel that your signs and   Symptoms warrant a physician's attention, call the Emergency Room at   [phone] [phone].   Othern/a.   FOLLOW UP APPOINTMENT 07/05/2015 @ 10:15 AM

## 2015-06-15 ENCOUNTER — Encounter (HOSPITAL_COMMUNITY): Payer: Self-pay | Admitting: Ophthalmology

## 2015-07-12 DIAGNOSIS — E782 Mixed hyperlipidemia: Secondary | ICD-10-CM | POA: Diagnosis not present

## 2015-07-12 DIAGNOSIS — I10 Essential (primary) hypertension: Secondary | ICD-10-CM | POA: Diagnosis not present

## 2015-09-21 DIAGNOSIS — H401113 Primary open-angle glaucoma, right eye, severe stage: Secondary | ICD-10-CM | POA: Diagnosis not present

## 2015-10-06 DIAGNOSIS — Z79899 Other long term (current) drug therapy: Secondary | ICD-10-CM | POA: Diagnosis not present

## 2015-10-06 DIAGNOSIS — K449 Diaphragmatic hernia without obstruction or gangrene: Secondary | ICD-10-CM | POA: Diagnosis not present

## 2015-10-06 DIAGNOSIS — Z86010 Personal history of colonic polyps: Secondary | ICD-10-CM | POA: Diagnosis not present

## 2015-10-06 DIAGNOSIS — R1033 Periumbilical pain: Secondary | ICD-10-CM | POA: Diagnosis not present

## 2015-11-16 DIAGNOSIS — Z79899 Other long term (current) drug therapy: Secondary | ICD-10-CM | POA: Diagnosis not present

## 2015-11-16 DIAGNOSIS — Z1389 Encounter for screening for other disorder: Secondary | ICD-10-CM | POA: Diagnosis not present

## 2015-11-16 DIAGNOSIS — R5383 Other fatigue: Secondary | ICD-10-CM | POA: Diagnosis not present

## 2015-11-16 DIAGNOSIS — I10 Essential (primary) hypertension: Secondary | ICD-10-CM | POA: Diagnosis not present

## 2015-12-05 DIAGNOSIS — R0683 Snoring: Secondary | ICD-10-CM | POA: Diagnosis not present

## 2015-12-05 DIAGNOSIS — Z719 Counseling, unspecified: Secondary | ICD-10-CM | POA: Diagnosis not present

## 2015-12-07 DIAGNOSIS — G4730 Sleep apnea, unspecified: Secondary | ICD-10-CM | POA: Diagnosis not present

## 2015-12-27 DIAGNOSIS — H401113 Primary open-angle glaucoma, right eye, severe stage: Secondary | ICD-10-CM | POA: Diagnosis not present

## 2016-01-02 DIAGNOSIS — G4733 Obstructive sleep apnea (adult) (pediatric): Secondary | ICD-10-CM | POA: Diagnosis not present

## 2016-01-09 ENCOUNTER — Encounter (HOSPITAL_COMMUNITY): Payer: Self-pay | Admitting: Emergency Medicine

## 2016-01-09 ENCOUNTER — Emergency Department (HOSPITAL_COMMUNITY)
Admission: EM | Admit: 2016-01-09 | Discharge: 2016-01-09 | Disposition: A | Discharge status: Home / Self Care | Payer: No Typology Code available for payment source | Attending: Emergency Medicine | Admitting: Emergency Medicine

## 2016-01-09 ENCOUNTER — Emergency Department (HOSPITAL_COMMUNITY): Payer: No Typology Code available for payment source

## 2016-01-09 DIAGNOSIS — M545 Low back pain, unspecified: Secondary | ICD-10-CM

## 2016-01-09 DIAGNOSIS — I10 Essential (primary) hypertension: Secondary | ICD-10-CM | POA: Insufficient documentation

## 2016-01-09 DIAGNOSIS — Y999 Unspecified external cause status: Secondary | ICD-10-CM | POA: Insufficient documentation

## 2016-01-09 DIAGNOSIS — Y92410 Unspecified street and highway as the place of occurrence of the external cause: Secondary | ICD-10-CM | POA: Diagnosis not present

## 2016-01-09 DIAGNOSIS — Y9389 Activity, other specified: Secondary | ICD-10-CM | POA: Insufficient documentation

## 2016-01-09 DIAGNOSIS — M542 Cervicalgia: Secondary | ICD-10-CM | POA: Diagnosis not present

## 2016-01-09 DIAGNOSIS — S199XXA Unspecified injury of neck, initial encounter: Secondary | ICD-10-CM | POA: Diagnosis present

## 2016-01-09 DIAGNOSIS — Z79899 Other long term (current) drug therapy: Secondary | ICD-10-CM | POA: Insufficient documentation

## 2016-01-09 MED ORDER — HYDROCODONE-ACETAMINOPHEN 5-325 MG PO TABS
1.0000 | ORAL_TABLET | Freq: Once | ORAL | Status: AC
  Administered 2016-01-09: 1 via ORAL
  Filled 2016-01-09: qty 1

## 2016-01-09 MED ORDER — HYDROCODONE-ACETAMINOPHEN 5-325 MG PO TABS: 1.0000 | ORAL_TABLET | Freq: Four times a day (QID) | ORAL | 0 refills | Status: DC | PRN

## 2016-01-09 MED ORDER — CYCLOBENZAPRINE HCL 10 MG PO TABS: 10.0000 mg | ORAL_TABLET | Freq: Every evening | ORAL | 0 refills | Status: DC | PRN
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
## 2016-01-11 ENCOUNTER — Ambulatory Visit (INDEPENDENT_AMBULATORY_CARE_PROVIDER_SITE_OTHER): Payer: Medicare Other | Admitting: Orthopaedic Surgery

## 2016-01-11 ENCOUNTER — Encounter: Payer: Self-pay | Admitting: Orthopaedic Surgery

## 2016-01-11 VITALS — BP 144/70 | HR 72 | Temp 97.9°F | Ht 71.0 in | Wt 212.0 lb

## 2016-01-11 DIAGNOSIS — M542 Cervicalgia: Secondary | ICD-10-CM | POA: Diagnosis not present

## 2016-01-11 DIAGNOSIS — M545 Low back pain, unspecified: Secondary | ICD-10-CM

## 2016-01-11 MED ORDER — HYDROCODONE-ACETAMINOPHEN 5-325 MG PO TABS: 1.0000 | ORAL_TABLET | ORAL | 0 refills | Status: DC | PRN

## 2016-01-11 MED ORDER — CYCLOBENZAPRINE HCL 10 MG PO TABS: 10.0000 mg | ORAL_TABLET | Freq: Every day | ORAL | 1 refills | Status: DC
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
## 2016-01-11 NOTE — Patient Instructions (Signed)
Begin PT. 

## 2016-01-17 ENCOUNTER — Encounter: Payer: Self-pay | Admitting: Orthopaedic Surgery

## 2016-01-18 DIAGNOSIS — Z1389 Encounter for screening for other disorder: Secondary | ICD-10-CM | POA: Diagnosis not present

## 2016-01-18 DIAGNOSIS — H6123 Impacted cerumen, bilateral: Secondary | ICD-10-CM | POA: Diagnosis not present

## 2016-01-18 DIAGNOSIS — R42 Dizziness and giddiness: Secondary | ICD-10-CM | POA: Diagnosis not present

## 2016-01-18 DIAGNOSIS — E039 Hypothyroidism, unspecified: Secondary | ICD-10-CM | POA: Diagnosis not present

## 2016-01-18 DIAGNOSIS — Z23 Encounter for immunization: Secondary | ICD-10-CM | POA: Diagnosis not present

## 2016-01-18 DIAGNOSIS — Z0001 Encounter for general adult medical examination with abnormal findings: Secondary | ICD-10-CM | POA: Diagnosis not present

## 2016-01-19 DIAGNOSIS — Z1389 Encounter for screening for other disorder: Secondary | ICD-10-CM | POA: Diagnosis not present

## 2016-01-19 DIAGNOSIS — Z0000 Encounter for general adult medical examination without abnormal findings: Secondary | ICD-10-CM | POA: Diagnosis not present

## 2016-01-19 DIAGNOSIS — Z0001 Encounter for general adult medical examination with abnormal findings: Secondary | ICD-10-CM | POA: Diagnosis not present

## 2016-01-19 DIAGNOSIS — R7309 Other abnormal glucose: Secondary | ICD-10-CM | POA: Diagnosis not present

## 2016-01-31 DIAGNOSIS — H401133 Primary open-angle glaucoma, bilateral, severe stage: Secondary | ICD-10-CM | POA: Diagnosis not present

## 2016-01-31 DIAGNOSIS — H25813 Combined forms of age-related cataract, bilateral: Secondary | ICD-10-CM | POA: Diagnosis not present

## 2016-02-02 DIAGNOSIS — G4733 Obstructive sleep apnea (adult) (pediatric): Secondary | ICD-10-CM | POA: Diagnosis not present

## 2016-02-07 ENCOUNTER — Encounter: Payer: Self-pay | Admitting: Orthopaedic Surgery

## 2016-02-07 ENCOUNTER — Ambulatory Visit (INDEPENDENT_AMBULATORY_CARE_PROVIDER_SITE_OTHER): Payer: Medicare Other | Admitting: Orthopaedic Surgery

## 2016-02-07 VITALS — BP 147/79 | HR 62 | Temp 98.1°F | Ht 71.0 in | Wt 215.0 lb

## 2016-02-07 DIAGNOSIS — M545 Low back pain, unspecified: Secondary | ICD-10-CM

## 2016-02-07 DIAGNOSIS — M542 Cervicalgia: Secondary | ICD-10-CM

## 2016-02-07 NOTE — Patient Instructions (Signed)
May return to work  
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
## 2016-02-10 DIAGNOSIS — G4733 Obstructive sleep apnea (adult) (pediatric): Secondary | ICD-10-CM | POA: Diagnosis not present

## 2016-02-13 DIAGNOSIS — G4733 Obstructive sleep apnea (adult) (pediatric): Secondary | ICD-10-CM | POA: Diagnosis not present

## 2016-02-20 DIAGNOSIS — H9313 Tinnitus, bilateral: Secondary | ICD-10-CM | POA: Diagnosis not present

## 2016-02-20 DIAGNOSIS — G4733 Obstructive sleep apnea (adult) (pediatric): Secondary | ICD-10-CM | POA: Diagnosis not present

## 2016-02-20 DIAGNOSIS — I10 Essential (primary) hypertension: Secondary | ICD-10-CM | POA: Diagnosis not present

## 2016-02-20 DIAGNOSIS — Z1389 Encounter for screening for other disorder: Secondary | ICD-10-CM | POA: Diagnosis not present

## 2016-02-29 DIAGNOSIS — G4733 Obstructive sleep apnea (adult) (pediatric): Secondary | ICD-10-CM | POA: Diagnosis not present

## 2016-03-03 DIAGNOSIS — G4733 Obstructive sleep apnea (adult) (pediatric): Secondary | ICD-10-CM | POA: Diagnosis not present

## 2016-03-13 DIAGNOSIS — H401133 Primary open-angle glaucoma, bilateral, severe stage: Secondary | ICD-10-CM | POA: Diagnosis not present

## 2016-03-13 DIAGNOSIS — H25813 Combined forms of age-related cataract, bilateral: Secondary | ICD-10-CM | POA: Diagnosis not present

## 2016-03-20 ENCOUNTER — Encounter: Payer: Self-pay | Admitting: Orthopaedic Surgery

## 2016-03-20 ENCOUNTER — Ambulatory Visit (INDEPENDENT_AMBULATORY_CARE_PROVIDER_SITE_OTHER): Payer: Medicare Other | Admitting: Orthopaedic Surgery

## 2016-03-20 VITALS — BP 132/70 | HR 74 | Temp 97.7°F | Ht 71.0 in | Wt 211.0 lb

## 2016-03-20 DIAGNOSIS — M542 Cervicalgia: Secondary | ICD-10-CM

## 2016-03-20 MED ORDER — HYDROCODONE-ACETAMINOPHEN 5-325 MG PO TABS: 1.0000 | ORAL_TABLET | ORAL | 0 refills | Status: DC | PRN
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
## 2016-04-03 DIAGNOSIS — G4733 Obstructive sleep apnea (adult) (pediatric): Secondary | ICD-10-CM | POA: Diagnosis not present

## 2016-04-24 DIAGNOSIS — H25813 Combined forms of age-related cataract, bilateral: Secondary | ICD-10-CM | POA: Diagnosis not present

## 2016-04-24 DIAGNOSIS — H401132 Primary open-angle glaucoma, bilateral, moderate stage: Secondary | ICD-10-CM | POA: Diagnosis not present

## 2016-05-04 DIAGNOSIS — G4733 Obstructive sleep apnea (adult) (pediatric): Secondary | ICD-10-CM | POA: Diagnosis not present

## 2016-05-22 DIAGNOSIS — H401132 Primary open-angle glaucoma, bilateral, moderate stage: Secondary | ICD-10-CM | POA: Diagnosis not present

## 2016-05-22 DIAGNOSIS — H25813 Combined forms of age-related cataract, bilateral: Secondary | ICD-10-CM | POA: Diagnosis not present

## 2016-05-29 DIAGNOSIS — G4733 Obstructive sleep apnea (adult) (pediatric): Secondary | ICD-10-CM | POA: Diagnosis not present

## 2016-06-01 DIAGNOSIS — G4733 Obstructive sleep apnea (adult) (pediatric): Secondary | ICD-10-CM | POA: Diagnosis not present

## 2016-06-18 DIAGNOSIS — E039 Hypothyroidism, unspecified: Secondary | ICD-10-CM | POA: Diagnosis not present

## 2016-06-18 DIAGNOSIS — Z1389 Encounter for screening for other disorder: Secondary | ICD-10-CM | POA: Diagnosis not present

## 2016-06-18 DIAGNOSIS — M13861 Other specified arthritis, right knee: Secondary | ICD-10-CM | POA: Diagnosis not present

## 2016-07-02 DIAGNOSIS — G4733 Obstructive sleep apnea (adult) (pediatric): Secondary | ICD-10-CM | POA: Diagnosis not present

## 2016-07-24 DIAGNOSIS — E039 Hypothyroidism, unspecified: Secondary | ICD-10-CM | POA: Diagnosis not present

## 2016-07-24 DIAGNOSIS — R109 Unspecified abdominal pain: Secondary | ICD-10-CM | POA: Diagnosis not present

## 2016-07-24 DIAGNOSIS — E063 Autoimmune thyroiditis: Secondary | ICD-10-CM | POA: Diagnosis not present

## 2016-07-24 DIAGNOSIS — Z1389 Encounter for screening for other disorder: Secondary | ICD-10-CM | POA: Diagnosis not present

## 2016-07-24 DIAGNOSIS — G4709 Other insomnia: Secondary | ICD-10-CM | POA: Diagnosis not present

## 2016-08-01 DIAGNOSIS — G4733 Obstructive sleep apnea (adult) (pediatric): Secondary | ICD-10-CM | POA: Diagnosis not present

## 2016-08-15 DIAGNOSIS — E039 Hypothyroidism, unspecified: Secondary | ICD-10-CM | POA: Diagnosis not present

## 2016-08-20 DIAGNOSIS — W57XXXA Bitten or stung by nonvenomous insect and other nonvenomous arthropods, initial encounter: Secondary | ICD-10-CM | POA: Diagnosis not present

## 2016-08-20 DIAGNOSIS — M1991 Primary osteoarthritis, unspecified site: Secondary | ICD-10-CM | POA: Diagnosis not present

## 2016-09-01 DIAGNOSIS — G4733 Obstructive sleep apnea (adult) (pediatric): Secondary | ICD-10-CM | POA: Diagnosis not present

## 2016-10-01 DIAGNOSIS — G4733 Obstructive sleep apnea (adult) (pediatric): Secondary | ICD-10-CM | POA: Diagnosis not present

## 2016-10-11 DIAGNOSIS — M7661 Achilles tendinitis, right leg: Secondary | ICD-10-CM | POA: Diagnosis not present

## 2016-10-11 DIAGNOSIS — M19071 Primary osteoarthritis, right ankle and foot: Secondary | ICD-10-CM | POA: Diagnosis not present

## 2016-10-11 DIAGNOSIS — M722 Plantar fascial fibromatosis: Secondary | ICD-10-CM | POA: Diagnosis not present

## 2016-10-11 DIAGNOSIS — M79672 Pain in left foot: Secondary | ICD-10-CM | POA: Diagnosis not present

## 2016-10-11 DIAGNOSIS — M7662 Achilles tendinitis, left leg: Secondary | ICD-10-CM | POA: Diagnosis not present

## 2016-10-11 DIAGNOSIS — M25571 Pain in right ankle and joints of right foot: Secondary | ICD-10-CM | POA: Diagnosis not present

## 2016-10-11 DIAGNOSIS — M19072 Primary osteoarthritis, left ankle and foot: Secondary | ICD-10-CM | POA: Diagnosis not present

## 2016-10-24 ENCOUNTER — Ambulatory Visit: Payer: Self-pay | Admitting: Podiatry

## 2016-11-01 DIAGNOSIS — G4733 Obstructive sleep apnea (adult) (pediatric): Secondary | ICD-10-CM | POA: Diagnosis not present

## 2016-11-22 DIAGNOSIS — H25811 Combined forms of age-related cataract, right eye: Secondary | ICD-10-CM | POA: Diagnosis not present

## 2016-11-22 DIAGNOSIS — H25812 Combined forms of age-related cataract, left eye: Secondary | ICD-10-CM | POA: Diagnosis not present

## 2016-11-22 DIAGNOSIS — H401132 Primary open-angle glaucoma, bilateral, moderate stage: Secondary | ICD-10-CM | POA: Diagnosis not present

## 2016-12-02 DIAGNOSIS — G4733 Obstructive sleep apnea (adult) (pediatric): Secondary | ICD-10-CM | POA: Diagnosis not present

## 2016-12-06 DIAGNOSIS — G4733 Obstructive sleep apnea (adult) (pediatric): Secondary | ICD-10-CM | POA: Diagnosis not present

## 2017-01-01 DIAGNOSIS — G4733 Obstructive sleep apnea (adult) (pediatric): Secondary | ICD-10-CM | POA: Diagnosis not present

## 2017-04-03 DIAGNOSIS — G4733 Obstructive sleep apnea (adult) (pediatric): Secondary | ICD-10-CM | POA: Diagnosis not present

## 2017-04-03 DIAGNOSIS — Z1389 Encounter for screening for other disorder: Secondary | ICD-10-CM | POA: Diagnosis not present

## 2017-04-03 DIAGNOSIS — K5730 Diverticulosis of large intestine without perforation or abscess without bleeding: Secondary | ICD-10-CM | POA: Diagnosis not present

## 2017-04-03 DIAGNOSIS — E063 Autoimmune thyroiditis: Secondary | ICD-10-CM | POA: Diagnosis not present

## 2017-04-03 DIAGNOSIS — R109 Unspecified abdominal pain: Secondary | ICD-10-CM | POA: Diagnosis not present

## 2017-04-03 DIAGNOSIS — R5383 Other fatigue: Secondary | ICD-10-CM | POA: Diagnosis not present

## 2017-04-03 DIAGNOSIS — Z6826 Body mass index (BMI) 26.0-26.9, adult: Secondary | ICD-10-CM | POA: Diagnosis not present

## 2017-04-08 ENCOUNTER — Encounter: Payer: Self-pay | Admitting: Internal Medicine

## 2017-04-23 DIAGNOSIS — H25811 Combined forms of age-related cataract, right eye: Secondary | ICD-10-CM | POA: Diagnosis not present

## 2017-04-23 DIAGNOSIS — H401132 Primary open-angle glaucoma, bilateral, moderate stage: Secondary | ICD-10-CM | POA: Diagnosis not present

## 2017-04-23 DIAGNOSIS — H25812 Combined forms of age-related cataract, left eye: Secondary | ICD-10-CM | POA: Diagnosis not present

## 2017-05-01 DIAGNOSIS — Z6826 Body mass index (BMI) 26.0-26.9, adult: Secondary | ICD-10-CM | POA: Diagnosis not present

## 2017-05-01 DIAGNOSIS — I10 Essential (primary) hypertension: Secondary | ICD-10-CM | POA: Diagnosis not present

## 2017-05-01 DIAGNOSIS — E063 Autoimmune thyroiditis: Secondary | ICD-10-CM | POA: Diagnosis not present

## 2017-05-01 DIAGNOSIS — G4733 Obstructive sleep apnea (adult) (pediatric): Secondary | ICD-10-CM | POA: Diagnosis not present

## 2017-05-01 DIAGNOSIS — H409 Unspecified glaucoma: Secondary | ICD-10-CM | POA: Diagnosis not present

## 2017-05-01 DIAGNOSIS — H9313 Tinnitus, bilateral: Secondary | ICD-10-CM | POA: Diagnosis not present

## 2017-05-01 DIAGNOSIS — E663 Overweight: Secondary | ICD-10-CM | POA: Diagnosis not present

## 2017-05-01 DIAGNOSIS — R69 Illness, unspecified: Secondary | ICD-10-CM | POA: Diagnosis not present

## 2017-05-14 ENCOUNTER — Ambulatory Visit: Payer: Medicare HMO | Admitting: Internal Medicine

## 2017-05-14 ENCOUNTER — Encounter: Payer: Self-pay | Admitting: *Deleted

## 2017-05-14 ENCOUNTER — Other Ambulatory Visit: Payer: Self-pay | Admitting: *Deleted

## 2017-05-14 ENCOUNTER — Encounter: Payer: Self-pay | Admitting: Internal Medicine

## 2017-05-14 VITALS — BP 131/88 | HR 60 | Temp 98.3°F | Ht 73.0 in | Wt 197.0 lb

## 2017-05-14 DIAGNOSIS — R1013 Epigastric pain: Secondary | ICD-10-CM | POA: Diagnosis not present

## 2017-05-14 DIAGNOSIS — R109 Unspecified abdominal pain: Secondary | ICD-10-CM

## 2017-05-14 NOTE — Patient Instructions (Signed)
I will get records from Dr. Glennon Hamilton - and will review.  I will call you once I review the records

## 2017-05-14 NOTE — Progress Notes (Signed)
Primary Care Physician:  Redmond School, MD Primary Gastroenterologist:  Dr. Gala Romney  Pre-Procedure History & Physical: HPI:  John Melendez is a 72 y.o. male here for evaluation of what now amounts to a 4 year history of periumbilical abdominal pain which occurs almost exclusively at night when he's sleeping supine.  Patient typically goes to bed at night gets to sleep and within a few hours.  Patient states essentially every night, a good 3-4 hours after falling asleep, he awakened with per-umbilical pain. He usually awakes in the supine position. He turns on his right side and pain goes away for a period of time and then returns but at a less severe level. He gets out of bed walks around and within several minutes pain has resolved. He does not get the pain during the day. Pain is not precipitated by eating a meal or bowel movements. He denies diarrhea, constipation, melena or rectal bleeding. He denies weight loss. I saw him back in 2015 for these symptoms at which time pain started to be a problem. His workup has included an EGD, colonoscopy and CT scan.  EGD demonstrated noncritical Schatzki's ring and a small hiatal hernia.  I felt there were some mild extrinsic compression. Colonoscopy demonstrated small small adenoma and  diverticulosis-slated for surveillance 2022;  .CT demonstrated only hiatal hernia and a nonobstructing right renal calculus. Nothing to explain extrinsic compression.  Since I last saw him, he saw Dr. Glennon Hamilton in [place]. He described multiple visits multiple different medications. Nothing helpful. I do not have any of those records for review.  Apparently, the next step discussed was a laparoscopy as described by the patient.  Past Medical History:  Diagnosis Date  . Anxiety   . GERD (gastroesophageal reflux disease)    no PPI currently, symptoms controlled with diet and behavior  . Hypertension     Past Surgical History:  Procedure Laterality Date  . BACK  SURGERY    . CHOLECYSTECTOMY    . COLONOSCOPY  02/15/14   [id number] papilla internal hemorrhoids/colonic diverticulosis/multiple colonic polyp  . ESOPHAGOGASTRODUODENOSCOPY  2004   Dr. Gala Romney: non-critical Schatzki's ring s/p dilation with 53 F, small hiatal hernia, normal D1 and D2   . ESOPHAGOGASTRODUODENOSCOPY  02/15/14   [id number] schatzki's ring not manipulated/hiatal hernia  . SLT LASER APPLICATION Right 05/13/9177   Procedure: SLT LASER APPLICATION;  Surgeon: Williams Che, MD;  Location: [facility];  Service: Ophthalmology;  Laterality: Right;    Prior to Admission medications   Medication Sig Start Date End Date Taking? Authorizing Provider  ALPRAZolam Duanne Moron) 0.5 MG tablet Take 0.5 mg by mouth 3 (three) times daily as needed.   Yes [provider]  brimonidine (ALPHAGAN) 0.2 % ophthalmic solution 2 (two) times daily.   Yes [provider]  FLUoxetine (PROZAC) 20 MG tablet Take 20 mg by mouth daily.   Yes [provider]  latanoprost (XALATAN) 0.005 % ophthalmic solution 1 drop at bedtime.   Yes [provider]  Levothyroxine Sodium 88 MCG CAPS Take 88 mcg by mouth daily before breakfast.   Yes [provider]  lisinopril (PRINIVIL,ZESTRIL) 10 MG tablet Take 10 mg by mouth daily.   Yes [provider]  loratadine (CLARITIN) 10 MG tablet Take 10 mg by mouth daily.   Yes [provider]  timolol (BETIMOL) 0.5 % ophthalmic solution 1 drop 2 (two) times daily.   Yes [provider]  cyclobenzaprine (FLEXERIL) 10 MG tablet Take 1 tablet (  10 mg total) by mouth at bedtime and may repeat dose one time if needed. Patient not taking: Reported on 05/14/2017 01/09/16   Recardo Evangelist, PA-C  cyclobenzaprine (FLEXERIL) 10 MG tablet Take 1 tablet (10 mg total) by mouth at bedtime. Patient not taking: Reported on 05/14/2017 01/11/16   Sanjuana Kava, MD  HYDROcodone-acetaminophen (NORCO/VICODIN) 5-325 MG tablet Take 1 tablet by  mouth every 4 (four) hours as needed for moderate pain (Must last 14 days.Do not take and drive a car or use machinery.). Patient not taking: Reported on 05/14/2017 03/20/16   Sanjuana Kava, MD  pantoprazole (PROTONIX) 40 MG tablet TAKE 1 TABLET ONCE A DAY 30 MINUTES BEFORE BREAKFAST. Patient not taking: Reported on 05/14/2017 06/16/14   Mahala Menghini, PA-C  polyethylene glycol-electrolytes (NULYTELY/GOLYTELY) 420 G solution Take 4,000 mLs by mouth once. Patient not taking: Reported on 05/14/2017 02/14/14   Rogene Houston, MD  Specialty Vitamins Products (MAGNESIUM, AMINO ACID CHELATE,) 133 MG tablet Take 1 tablet by mouth daily.    [provider]    Allergies as of 05/14/2017 - Review Complete 05/14/2017  Allergen Reaction Noted  . Penicillins Nausea Only 04/23/2011    Family History  Problem Relation Age of Onset  . Colon cancer Neg Hx     Social History   Socioeconomic History  . Marital status: Divorced    Spouse name: Not on file  . Number of children: 7  . Years of education: Not on file  . Highest education level: Not on file  Social Needs  . Financial resource strain: Not on file  . Food insecurity - worry: Not on file  . Food insecurity - inability: Not on file  . Transportation needs - medical: Not on file  . Transportation needs - non-medical: Not on file  Occupational History  . Not on file  Tobacco Use  . Smoking status: Never Smoker  . Smokeless tobacco: Never Used  Substance and Sexual Activity  . Alcohol use: No  . Drug use: No  . Sexual activity: Not on file  Other Topics Concern  . Not on file  Social History Narrative  . Not on file    Review of Systems: See HPI, otherwise negative ROS  Physical Exam: BP 131/88   Pulse 60   Temp 98.3 F (36.8 C) (Oral)   Ht 6\' 1"  (1.854 m)   Wt 197 lb (89.4 kg)   BMI 25.99 kg/m  General:   Alert,  , pleasant and cooperative in NAD Neck:  Supple; no masses or thyromegaly. No significant cervical  adenopathy. Lungs:  Clear throughout to auscultation.   No wheezes, crackles, or rhonchi. No acute distress. Heart:  Regular rate and rhythm; no murmurs, clicks, rubs,  or gallops. Abdomen: Non-distended, normal bowel sounds.  Soft and nontender without appreciable mass or hepatosplenomegaly.  Pulses:  Normal pulses noted. Extremities:  Without clubbing or edema.  Impression: Very interesting 72 year old gentleman with abdominal pain as described above.  He clearly describes a positional component to his pain which makes me think it is of mechanical origin. Something like a Spigelain hernia or adhesive band not appreciated on cross-sectional imaging, may well be at play here. Certainly, no obstructing symptoms, whatsoever. Symptoms do not necessarily sound visceral or ischemic in origin. Symptoms not consistent with acid peptic disease or pancreatic etiology.  I need to see Dr. Theodoro Grist records before making further recommendations.   Recommendations:  I told the patient I will review Dr. Earleen Newport workup  when records arrive and then will make further recommendations as to further diagnostic evaluation.  Patient is agreeable to this plan  I see no need for EGD or colonoscopy at this time however;  will keep the 2023 surveillance examination on the books for the time being.    Addendum:  I have reviewed Dr. Earleen Newport records.;  He was seen several times in that practice. Symptoms  chronicled are very similar to what he told me today., Small bowel follow-through down there was normal. He was tried on antidepressants and anti-spasmodics without any improvement. There was a some conjecture of ischemia during the night related to timing of antihypertensives. That apparently did not pan out.  Mechanical/positional component to his abdominal pain is persisting and compelling.  I have spoke to the patient and recommended a surgical consultation for possible occult hernia or other intra-abdominal  pathology.  I recommend he see Dr. Arnoldo Morale. He is agreeable. I have called Dr. Arnoldo Morale and explained the situation him. He has kindly agreed to see the patient in the office and go from there. We will make referral.               Notice: This dictation was prepared with Dragon dictation along with smaller phrase technology. Any transcriptional errors that result from this process are unintentional and may not be corrected upon review.

## 2017-05-14 NOTE — Patient Instructions (Signed)
Per RMR refer to Dr. Adline Mango office for abd pain. Referral has been sent

## 2017-06-04 ENCOUNTER — Ambulatory Visit: Payer: Medicare HMO | Admitting: General Surgery

## 2017-06-04 ENCOUNTER — Encounter: Payer: Self-pay | Admitting: General Surgery

## 2017-06-04 VITALS — BP 134/74 | HR 60 | Temp 98.7°F | Ht 73.0 in | Wt 198.0 lb

## 2017-06-04 DIAGNOSIS — K432 Incisional hernia without obstruction or gangrene: Secondary | ICD-10-CM

## 2017-06-04 NOTE — Progress Notes (Signed)
John Melendez; [id number]; 1946/03/07   HPI Patient is a 72 year old white male who was referred to my care by Dr. Gala Romney for evaluation and treatment of periumbilical abdominal pain.  He has had this pain intermittently for 4 years.  He has had extensive workup and no intra-abdominal etiology can be found for his pain.  He states the pain is made worse with straining or when he is lying in bed.  It does come and go.  It is not associated with any bowel movements.  He denies any fever or chills.  He currently has 0 out of 10 abdominal pain.  He states the pain did develop soon after having a laparoscopic cholecystectomy.  The pain was around 1 of his incision sites at the umbilicus. Past Medical History:  Diagnosis Date  . Anxiety   . GERD (gastroesophageal reflux disease)    no PPI currently, symptoms controlled with diet and behavior  . Hypertension     Past Surgical History:  Procedure Laterality Date  . BACK SURGERY    . CHOLECYSTECTOMY    . COLONOSCOPY  02/15/14   [id number] papilla internal hemorrhoids/colonic diverticulosis/multiple colonic polyp  . ESOPHAGOGASTRODUODENOSCOPY  2004   Dr. Gala Romney: non-critical Schatzki's ring s/p dilation with 23 F, small hiatal hernia, normal D1 and D2   . ESOPHAGOGASTRODUODENOSCOPY  02/15/14   [id number] schatzki's ring not manipulated/hiatal hernia  . SLT LASER APPLICATION Right 04/16/4268   Procedure: SLT LASER APPLICATION;  Surgeon: Williams Che, MD;  Location: [facility];  Service: Ophthalmology;  Laterality: Right;    Family History  Problem Relation Age of Onset  . Colon cancer Neg Hx     Current Outpatient Medications on File Prior to Visit  Medication Sig Dispense Refill  . ALPRAZolam (XANAX) 0.5 MG tablet Take 0.5 mg by mouth 3 (three) times daily as needed.    . brimonidine (ALPHAGAN) 0.2 % ophthalmic solution 2 (two) times daily.    . cyclobenzaprine (FLEXERIL) 10 MG tablet Take 1 tablet (10 mg total) by mouth at bedtime and may  repeat dose one time if needed. (Patient not taking: Reported on 05/14/2017) 20 tablet 0  . cyclobenzaprine (FLEXERIL) 10 MG tablet Take 1 tablet (10 mg total) by mouth at bedtime. (Patient not taking: Reported on 05/14/2017) 30 tablet 1  . FLUoxetine (PROZAC) 20 MG tablet Take 20 mg by mouth daily.    Marland Kitchen HYDROcodone-acetaminophen (NORCO/VICODIN) 5-325 MG tablet Take 1 tablet by mouth every 4 (four) hours as needed for moderate pain (Must last 14 days.Do not take and drive a car or use machinery.). (Patient not taking: Reported on 05/14/2017) 56 tablet 0  . latanoprost (XALATAN) 0.005 % ophthalmic solution 1 drop at bedtime.    . Levothyroxine Sodium 88 MCG CAPS Take 88 mcg by mouth daily before breakfast.    . lisinopril (PRINIVIL,ZESTRIL) 10 MG tablet Take 10 mg by mouth daily.    Marland Kitchen loratadine (CLARITIN) 10 MG tablet Take 10 mg by mouth daily.    . pantoprazole (PROTONIX) 40 MG tablet TAKE 1 TABLET ONCE A DAY 30 MINUTES BEFORE BREAKFAST. (Patient not taking: Reported on 05/14/2017) 30 tablet 11  . polyethylene glycol-electrolytes (NULYTELY/GOLYTELY) 420 G solution Take 4,000 mLs by mouth once. (Patient not taking: Reported on 05/14/2017) 4000 mL 0  . Specialty Vitamins Products (MAGNESIUM, AMINO ACID CHELATE,) 133 MG tablet Take 1 tablet by mouth daily.    . timolol (BETIMOL) 0.5 % ophthalmic solution 1 drop 2 (two) times daily.  No current facility-administered medications on file prior to visit.     Allergies  Allergen Reactions  . Penicillins Nausea Only    Social History   Substance and Sexual Activity  Alcohol Use No    Social History   Tobacco Use  Smoking Status Never Smoker  Smokeless Tobacco Never Used    Review of Systems  Constitutional: Positive for malaise/fatigue.  HENT: Negative.   Eyes: Positive for blurred vision.  Respiratory: Negative.   Cardiovascular: Negative.   Gastrointestinal: Positive for abdominal pain and heartburn.  Genitourinary: Negative.    Musculoskeletal: Positive for joint pain.  Skin: Negative.   Neurological: Negative.   Endo/Heme/Allergies: Negative.   Psychiatric/Behavioral: Negative.     Objective   Vitals:   06/04/17 1513  BP: 134/74  Pulse: 60  Temp: 98.7 F (37.1 C)    Physical Exam  Constitutional: He is oriented to person, place, and time and well-developed, well-nourished, and in no distress.  HENT:  Head: Normocephalic and atraumatic.  Cardiovascular: Normal rate, regular rhythm and normal heart sounds. Exam reveals no gallop and no friction rub.  No murmur heard. Pulmonary/Chest: Effort normal and breath sounds normal. No respiratory distress. He has no wheezes. He has no rales.  Abdominal: Soft. Bowel sounds are normal. He exhibits no distension and no mass. There is no tenderness. There is no rebound.  Patient has a small incisional hernia at the base of the umbilicus which somewhat reproduces his pain when palpated.  No other masses could be found.  No other hernias could be found.  No rigidity is noted.  Neurological: He is alert and oriented to person, place, and time.  Skin: Skin is warm and dry.  Vitals reviewed.  GI notes reviewed. Assessment  Abdominal pain secondary to incisional hernia Plan   Patient is scheduled for incisional herniorrhaphy with mesh on 06/10/2017.  The risks and benefits of the procedure including bleeding, infection, mesh use, the possibility of recurrence of the pain were fully explained to the patient, who gave informed consent.

## 2017-06-04 NOTE — Patient Instructions (Signed)
 Ventral Hernia A ventral hernia is a bulge of tissue from inside the abdomen that pushes through a weak area of the muscles that form the front wall of the abdomen. The tissues inside the abdomen are inside a sac (peritoneum). These tissues include the small intestine, large intestine, and the fatty tissue that covers the intestines (omentum). Sometimes, the bulge that forms a hernia contains intestines. Other hernias contain only fat. Ventral hernias do not go away without surgical treatment. There are several types of ventral hernias. You may have:  A hernia at an incision site from previous abdominal surgery (incisional hernia).  A hernia just above the belly button (epigastric hernia), or at the belly button (umbilical hernia). These types of hernias can develop from heavy lifting or straining.  A hernia that comes and goes (reducible hernia). It may be visible only when you lift or strain. This type of hernia can be pushed back into the abdomen (reduced).  A hernia that traps abdominal tissue inside the hernia (incarcerated hernia). This type of hernia does not reduce.  A hernia that cuts off blood flow to the tissues inside the hernia (strangulated hernia). The tissues can start to die if this happens. This is a very painful bulge that cannot be reduced. A strangulated hernia is a medical emergency.  What are the causes? This condition is caused by abdominal tissue putting pressure on an area of weakness in the abdominal muscles. What increases the risk? The following factors may make you more likely to develop this condition:  Being male.  Being 60 or older.  Being overweight or obese.  Having had previous abdominal surgery, especially if there was an infection after surgery.  Having had an injury to the abdominal wall.  Having had several pregnancies.  Having a buildup of fluid inside the abdomen (ascites).  What are the signs or symptoms? The only symptom of a ventral  hernia may be a painless bulge in the abdomen. A reducible hernia may be visible only when you strain, cough, or lift. Other symptoms may include:  Dull pain.  A feeling of pressure.  Signs and symptoms of a strangulated hernia may include:  Increasing pain.  Nausea and vomiting.  Pain when pressing on the hernia.  The skin over the hernia turning red or purple.  Constipation.  Blood in the stool (feces).  How is this diagnosed? This condition may be diagnosed based on:  Your symptoms.  Your medical history.  A physical exam. You may be asked to cough or strain while standing. These actions increase the pressure inside your abdomen and force the hernia through the opening in your muscles. Your health care provider may try to reduce the hernia by pressing on it.  Imaging studies, such as an ultrasound or CT scan.  How is this treated? This condition is treated with surgery. If you have a strangulated hernia, surgery is done as soon as possible. If your hernia is small and not incarcerated, you may be asked to lose some weight before surgery. Follow these instructions at home:  Follow instructions from your health care provider about eating or drinking restrictions.  If you are overweight, your health care provider may recommend that you increase your activity level and eat a healthier diet.  Do not lift anything that is heavier than 10 lb (4.5 kg).  Return to your normal activities as told by your health care provider. Ask your health care provider what activities are safe for you. You   may need to avoid activities that increase pressure on your hernia.  Take over-the-counter and prescription medicines only as told by your health care provider.  Keep all follow-up visits as told by your health care provider. This is important. Contact a health care provider if:  Your hernia gets larger.  Your hernia becomes painful. Get help right away if:  Your hernia becomes  increasingly painful.  You have pain along with any of the following: ? Changes in skin color in the area of the hernia. ? Nausea. ? Vomiting. ? Fever. Summary  A ventral hernia is a bulge of tissue from inside the abdomen that pushes through a weak area of the muscles that form the front wall of the abdomen.  This condition is treated with surgery, which may be urgent depending on your hernia.  Do not lift anything that is heavier than 10 lb (4.5 kg), and follow activity instructions from your health care provider. This information is not intended to replace advice given to you by your health care provider. Make sure you discuss any questions you have with your health care provider. Document Released: 02/13/2012 Document Revised: 10/14/2015 Document Reviewed: 10/14/2015 Elsevier Interactive Patient Education  2018 [employer]  

## 2017-06-04 NOTE — H&P (Signed)
John Melendez; [id number]; 1945/05/25   HPI Patient is a 72 year old white male who was referred to my care by Dr. Gala Romney for evaluation and treatment of periumbilical abdominal pain.  He has had this pain intermittently for 4 years.  He has had extensive workup and no intra-abdominal etiology can be found for his pain.  He states the pain is made worse with straining or when he is lying in bed.  It does come and go.  It is not associated with any bowel movements.  He denies any fever or chills.  He currently has 0 out of 10 abdominal pain.  He states the pain did develop soon after having a laparoscopic cholecystectomy.  The pain was around 1 of his incision sites at the umbilicus. Past Medical History:  Diagnosis Date  . Anxiety   . GERD (gastroesophageal reflux disease)    no PPI currently, symptoms controlled with diet and behavior  . Hypertension     Past Surgical History:  Procedure Laterality Date  . BACK SURGERY    . CHOLECYSTECTOMY    . COLONOSCOPY  02/15/14   FUX:NATF papilla internal hemorrhoids/colonic diverticulosis/multiple colonic polyp  . ESOPHAGOGASTRODUODENOSCOPY  2004   Dr. Gala Romney: non-critical Schatzki's ring s/p dilation with 53 F, small hiatal hernia, normal D1 and D2   . ESOPHAGOGASTRODUODENOSCOPY  02/15/14   TDD:[id number] schatzki's ring not manipulated/hiatal hernia  . SLT LASER APPLICATION Right 05/16/6281   Procedure: SLT LASER APPLICATION;  Surgeon: Williams Che, MD;  Location: [facility];  Service: Ophthalmology;  Laterality: Right;    Family History  Problem Relation Age of Onset  . Colon cancer Neg Hx     Current Outpatient Medications on File Prior to Visit  Medication Sig Dispense Refill  . ALPRAZolam (XANAX) 0.5 MG tablet Take 0.5 mg by mouth 3 (three) times daily as needed.    . brimonidine (ALPHAGAN) 0.2 % ophthalmic solution 2 (two) times daily.    . cyclobenzaprine (FLEXERIL) 10 MG tablet Take 1 tablet (10 mg total) by mouth at bedtime and may  repeat dose one time if needed. (Patient not taking: Reported on 05/14/2017) 20 tablet 0  . cyclobenzaprine (FLEXERIL) 10 MG tablet Take 1 tablet (10 mg total) by mouth at bedtime. (Patient not taking: Reported on 05/14/2017) 30 tablet 1  . FLUoxetine (PROZAC) 20 MG tablet Take 20 mg by mouth daily.    Marland Kitchen HYDROcodone-acetaminophen (NORCO/VICODIN) 5-325 MG tablet Take 1 tablet by mouth every 4 (four) hours as needed for moderate pain (Must last 14 days.Do not take and drive a car or use machinery.). (Patient not taking: Reported on 05/14/2017) 56 tablet 0  . latanoprost (XALATAN) 0.005 % ophthalmic solution 1 drop at bedtime.    . Levothyroxine Sodium 88 MCG CAPS Take 88 mcg by mouth daily before breakfast.    . lisinopril (PRINIVIL,ZESTRIL) 10 MG tablet Take 10 mg by mouth daily.    Marland Kitchen loratadine (CLARITIN) 10 MG tablet Take 10 mg by mouth daily.    . pantoprazole (PROTONIX) 40 MG tablet TAKE 1 TABLET ONCE A DAY 30 MINUTES BEFORE BREAKFAST. (Patient not taking: Reported on 05/14/2017) 30 tablet 11  . polyethylene glycol-electrolytes (NULYTELY/GOLYTELY) 420 G solution Take 4,000 mLs by mouth once. (Patient not taking: Reported on 05/14/2017) 4000 mL 0  . Specialty Vitamins Products (MAGNESIUM, AMINO ACID CHELATE,) 133 MG tablet Take 1 tablet by mouth daily.    . timolol (BETIMOL) 0.5 % ophthalmic solution 1 drop 2 (two) times daily.  No current facility-administered medications on file prior to visit.     Allergies  Allergen Reactions  . Penicillins Nausea Only    Social History   Substance and Sexual Activity  Alcohol Use No    Social History   Tobacco Use  Smoking Status Never Smoker  Smokeless Tobacco Never Used    Review of Systems  Constitutional: Positive for malaise/fatigue.  HENT: Negative.   Eyes: Positive for blurred vision.  Respiratory: Negative.   Cardiovascular: Negative.   Gastrointestinal: Positive for abdominal pain and heartburn.  Genitourinary: Negative.    Musculoskeletal: Positive for joint pain.  Skin: Negative.   Neurological: Negative.   Endo/Heme/Allergies: Negative.   Psychiatric/Behavioral: Negative.     Objective   Vitals:   06/04/17 1513  BP: 134/74  Pulse: 60  Temp: 98.7 F (37.1 C)    Physical Exam  Constitutional: He is oriented to person, place, and time and well-developed, well-nourished, and in no distress.  HENT:  Head: Normocephalic and atraumatic.  Cardiovascular: Normal rate, regular rhythm and normal heart sounds. Exam reveals no gallop and no friction rub.  No murmur heard. Pulmonary/Chest: Effort normal and breath sounds normal. No respiratory distress. He has no wheezes. He has no rales.  Abdominal: Soft. Bowel sounds are normal. He exhibits no distension and no mass. There is no tenderness. There is no rebound.  Patient has a small incisional hernia at the base of the umbilicus which somewhat reproduces his pain when palpated.  No other masses could be found.  No other hernias could be found.  No rigidity is noted.  Neurological: He is alert and oriented to person, place, and time.  Skin: Skin is warm and dry.  Vitals reviewed.  GI notes reviewed. Assessment  Abdominal pain secondary to incisional hernia Plan   Patient is scheduled for incisional herniorrhaphy with mesh on 06/10/2017.  The risks and benefits of the procedure including bleeding, infection, mesh use, the possibility of recurrence of the pain were fully explained to the patient, who gave informed consent.

## 2017-06-05 NOTE — Patient Instructions (Signed)
John Melendez  06/05/2017     @PREFPERIOPPHARMACY @   Your procedure is scheduled on  06/10/2017   Report to [facility] at  645   A.M.  Call this number if you have problems the morning of surgery:  [phone]   Remember:  Do not eat food or drink liquids after midnight.  Take these medicines the morning of surgery with A SIP OF WATER  Xanax, prozac, lisinopril, claritin.   Do not wear jewelry, make-up or nail polish.  Do not wear lotions, powders, or perfumes, or deodorant.  Do not shave 48 hours prior to surgery.  Men may shave face and neck.  Do not bring valuables to the hospital.  [facility] is not responsible for any belongings or valuables.  Contacts, dentures or bridgework may not be worn into surgery.  Leave your suitcase in the car.  After surgery it may be brought to your room.  For patients admitted to the hospital, discharge time will be determined by your treatment team.  Patients discharged the day of surgery will not be allowed to drive home.   Name and phone number of your driver:   family Special instructions:  None  Please read over the following fact sheets that you were given. Anesthesia Post-op Instructions and Care and Recovery After Surgery       Open Hernia Repair, Adult Open hernia repair is a surgical procedure to fix a hernia. A hernia occurs when an internal organ or tissue pushes out through a weak spot in the abdominal wall muscles. Hernias commonly occur in the groin and around the navel. Most hernias tend to get worse over time. Often, surgery is done to prevent the hernia from becoming bigger, uncomfortable, or an emergency. Emergency surgery may be needed if abdominal contents get stuck in the opening (incarcerated hernia) or the blood supply gets cut off (strangulated hernia). In an open repair, an incision is made in the abdomen to perform the surgery. Tell a health care provider about:  Any allergies you have.  All  medicines you are taking, including vitamins, herbs, eye drops, creams, and over-the-counter medicines.  Any problems you or family members have had with anesthetic medicines.  Any blood or bone disorders you have.  Any surgeries you have had.  Any medical conditions you have, including any recent cold or flu symptoms.  Whether you are pregnant or may be pregnant. What are the risks? Generally, this is a safe procedure. However, problems may occur, including:  Long-lasting (chronic) pain.  Bleeding.  Infection.  Damage to the testicle. This can cause shrinking or swelling.  Damage to the bladder, blood vessels, intestine, or nerves near the hernia.  Trouble passing urine.  Allergic reactions to medicines.  Return of the hernia.  What happens before the procedure? Staying hydrated Follow instructions from your health care provider about hydration, which may include:  Up to 2 hours before the procedure - you may continue to drink clear liquids, such as water, clear fruit juice, black coffee, and plain tea.  Eating and drinking restrictions Follow instructions from your health care provider about eating and drinking, which may include:  8 hours before the procedure - stop eating heavy meals or foods such as meat, fried foods, or fatty foods.  6 hours before the procedure - stop eating light meals or foods, such as toast or cereal.  6 hours before the procedure - stop drinking milk  or drinks that contain milk.  2 hours before the procedure - stop drinking clear liquids.  Medicines  Ask your health care provider about: ? Changing or stopping your regular medicines. This is especially important if you are taking diabetes medicines or blood thinners. ? Taking medicines such as aspirin and ibuprofen. These medicines can thin your blood. Do not take these medicines before your procedure if your health care provider instructs you not to.  You may be given antibiotic  medicine to help prevent infection. General instructions  You may have blood tests or imaging studies.  Ask your health care provider how your surgical site will be marked or identified.  If you smoke, do not smoke for at least 2 weeks before your procedure or for as long as told by your health care provider.  Let your health care provider know if you develop a cold or any infection before your surgery.  Plan to have someone take you home from the hospital or clinic.  If you will be going home right after the procedure, plan to have someone with you for 24 hours. What happens during the procedure?  To reduce your risk of infection: ? Your health care team will wash or sanitize their hands. ? Your skin will be washed with soap. ? Hair may be removed from the surgical area.  An IV tube will be inserted into one of your veins.  You will be given one or more of the following: ? A medicine to help you relax (sedative). ? A medicine to numb the area (local anesthetic). ? A medicine to make you fall asleep (general anesthetic).  Your surgeon will make an incision over the hernia.  The tissues of the hernia will be moved back into place.  The edges of the hernia may be stitched together.  The opening in the abdominal muscles will be closed with stitches (sutures). Or, your surgeon will place a mesh patch made of manmade (synthetic) material over the opening.  The incision will be closed.  A bandage (dressing) may be placed over the incision. The procedure may vary among health care providers and hospitals. What happens after the procedure?  Your blood pressure, heart rate, breathing rate, and blood oxygen level will be monitored until the medicines you were given have worn off.  You may be given medicine for pain.  Do not drive for 24 hours if you received a sedative. This information is not intended to replace advice given to you by your health care provider. Make sure you  discuss any questions you have with your health care provider. Document Released: 08/22/2000 Document Revised: 09/16/2015 Document Reviewed: 08/10/2015 Elsevier Interactive Patient Education  2018 [place], Adult, Care After These instructions give you information about caring for yourself after your procedure. Your doctor may also give you more specific instructions. If you have problems or questions, contact your doctor. Follow these instructions at home: Surgical cut (incision) care   Follow instructions from your doctor about how to take care of your surgical cut area. Make sure you: ? Wash your hands with soap and water before you change your bandage (dressing). If you cannot use soap and water, use hand sanitizer. ? Change your bandage as told by your doctor. ? Leave stitches (sutures), skin glue, or skin tape (adhesive) strips in place. They may need to stay in place for 2 weeks or longer. If tape strips get loose and curl up, you may trim the  loose edges. Do not remove tape strips completely unless your doctor says it is okay.  Check your surgical cut every day for signs of infection. Check for: ? More redness, swelling, or pain. ? More fluid or blood. ? Warmth. ? Pus or a bad smell. Activity  Do not drive or use heavy machinery while taking prescription pain medicine. Do not drive until your doctor says it is okay.  Until your doctor says it is okay: ? Do not lift anything that is heavier than 10 lb (4.5 kg). ? Do not play contact sports.  Return to your normal activities as told by your doctor. Ask your doctor what activities are safe. General instructions  To prevent or treat having a hard time pooping (constipation) while you are taking prescription pain medicine, your doctor may recommend that you: ? Drink enough fluid to keep your pee (urine) clear or pale yellow. ? Take over-the-counter or prescription medicines. ? Eat foods that are high in  fiber, such as fresh fruits and vegetables, whole grains, and beans. ? Limit foods that are high in fat and processed sugars, such as fried and sweet foods.  Take over-the-counter and prescription medicines only as told by your doctor.  Do not take baths, swim, or use a hot tub until your doctor says it is okay.  Keep all follow-up visits as told by your doctor. This is important. Contact a doctor if:  You develop a rash.  You have more redness, swelling, or pain around your surgical cut.  You have more fluid or blood coming from your surgical cut.  Your surgical cut feels warm to the touch.  You have pus or a bad smell coming from your surgical cut.  You have a fever or chills.  You have blood in your poop (stool).  You have not pooped in 2-3 days.  Medicine does not help your pain. Get help right away if:  You have chest pain or you are short of breath.  You feel light-headed.  You feel weak and dizzy (feel faint).  You have very bad pain.  You throw up (vomit) and your pain is worse. This information is not intended to replace advice given to you by your health care provider. Make sure you discuss any questions you have with your health care provider. Document Released: 03/19/2014 Document Revised: 09/16/2015 Document Reviewed: 08/10/2015 Elsevier Interactive Patient Education  2018 [place] Anesthesia, Adult General anesthesia is the use of medicines to make a person "go to sleep" (be unconscious) for a medical procedure. General anesthesia is often recommended when a procedure:  Is long.  Requires you to be still or in an unusual position.  Is major and can cause you to lose blood.  Is impossible to do without general anesthesia.  The medicines used for general anesthesia are called general anesthetics. In addition to making you sleep, the medicines:  Prevent pain.  Control your blood pressure.  Relax your muscles.  Tell a health care  provider about:  Any allergies you have.  All medicines you are taking, including vitamins, herbs, eye drops, creams, and over-the-counter medicines.  Any problems you or family members have had with anesthetic medicines.  Types of anesthetics you have had in the past.  Any bleeding disorders you have.  Any surgeries you have had.  Any medical conditions you have.  Any history of heart or lung conditions, such as heart failure, sleep apnea, or chronic obstructive pulmonary disease (COPD).  Whether  you are pregnant or may be pregnant.  Whether you use tobacco, alcohol, marijuana, or street drugs.  Any history of Armed forces logistics/support/administrative officer.  Any history of depression or anxiety. What are the risks? Generally, this is a safe procedure. However, problems may occur, including:  Allergic reaction to anesthetics.  Lung and heart problems.  Inhaling food or liquids from your stomach into your lungs (aspiration).  Injury to nerves.  Waking up during your procedure and being unable to move (rare).  Extreme agitation or a state of mental confusion (delirium) when you wake up from the anesthetic.  Air in the bloodstream, which can lead to stroke.  These problems are more likely to develop if you are having a major surgery or if you have an advanced medical condition. You can prevent some of these complications by answering all of your health care provider's questions thoroughly and by following all pre-procedure instructions. General anesthesia can cause side effects, including:  Nausea or vomiting  A sore throat from the breathing tube.  Feeling cold or shivery.  Feeling tired, washed out, or achy.  Sleepiness or drowsiness.  Confusion or agitation.  What happens before the procedure? Staying hydrated Follow instructions from your health care provider about hydration, which may include:  Up to 2 hours before the procedure - you may continue to drink clear liquids, such as  water, clear fruit juice, black coffee, and plain tea.  Eating and drinking restrictions Follow instructions from your health care provider about eating and drinking, which may include:  8 hours before the procedure - stop eating heavy meals or foods such as meat, fried foods, or fatty foods.  6 hours before the procedure - stop eating light meals or foods, such as toast or cereal.  6 hours before the procedure - stop drinking milk or drinks that contain milk.  2 hours before the procedure - stop drinking clear liquids.  Medicines  Ask your health care provider about: ? Changing or stopping your regular medicines. This is especially important if you are taking diabetes medicines or blood thinners. ? Taking medicines such as aspirin and ibuprofen. These medicines can thin your blood. Do not take these medicines before your procedure if your health care provider instructs you not to. ? Taking new dietary supplements or medicines. Do not take these during the week before your procedure unless your health care provider approves them.  If you are told to take a medicine or to continue taking a medicine on the day of the procedure, take the medicine with sips of water. General instructions   Ask if you will be going home the same day, the following day, or after a longer hospital stay. ? Plan to have someone take you home. ? Plan to have someone stay with you for the first 24 hours after you leave the hospital or clinic.  For 3-6 weeks before the procedure, try not to use any tobacco products, such as cigarettes, chewing tobacco, and e-cigarettes.  You may brush your teeth on the morning of the procedure, but make sure to spit out the toothpaste. What happens during the procedure?  You will be given anesthetics through a mask and through an IV tube in one of your veins.  You may receive medicine to help you relax (sedative).  As soon as you are asleep, a breathing tube may be used to  help you breathe.  An anesthesia specialist will stay with you throughout the procedure. He or she will help keep  you comfortable and safe by continuing to give you medicines and adjusting the amount of medicine that you get. He or she will also watch your blood pressure, pulse, and oxygen levels to make sure that the anesthetics do not cause any problems.  If a breathing tube was used to help you breathe, it will be removed before you wake up. The procedure may vary among health care providers and hospitals. What happens after the procedure?  You will wake up, often slowly, after the procedure is complete, usually in a recovery area.  Your blood pressure, heart rate, breathing rate, and blood oxygen level will be monitored until the medicines you were given have worn off.  You may be given medicine to help you calm down if you feel anxious or agitated.  If you will be going home the same day, your health care provider may check to make sure you can stand, drink, and urinate.  Your health care providers will treat your pain and side effects before you go home.  Do not drive for 24 hours if you received a sedative.  You may: ? Feel nauseous and vomit. ? Have a sore throat. ? Have mental slowness. ? Feel cold or shivery. ? Feel sleepy. ? Feel tired. ? Feel sore or achy, even in parts of your body where you did not have surgery. This information is not intended to replace advice given to you by your health care provider. Make sure you discuss any questions you have with your health care provider. Document Released: 06/05/2007 Document Revised: 08/09/2015 Document Reviewed: 02/10/2015 Elsevier Interactive Patient Education  2018 [place] Anesthesia, Adult, Care After These instructions provide you with information about caring for yourself after your procedure. Your health care provider may also give you more specific instructions. Your treatment has been planned according  to current medical practices, but problems sometimes occur. Call your health care provider if you have any problems or questions after your procedure. What can I expect after the procedure? After the procedure, it is common to have:  Vomiting.  A sore throat.  Mental slowness.  It is common to feel:  Nauseous.  Cold or shivery.  Sleepy.  Tired.  Sore or achy, even in parts of your body where you did not have surgery.  Follow these instructions at home: For at least 24 hours after the procedure:  Do not: ? Participate in activities where you could fall or become injured. ? Drive. ? Use heavy machinery. ? Drink alcohol. ? Take sleeping pills or medicines that cause drowsiness. ? Make important decisions or sign legal documents. ? Take care of children on your own.  Rest. Eating and drinking  If you vomit, drink water, juice, or soup when you can drink without vomiting.  Drink enough fluid to keep your urine clear or pale yellow.  Make sure you have little or no nausea before eating solid foods.  Follow the diet recommended by your health care provider. General instructions  Have a responsible adult stay with you until you are awake and alert.  Return to your normal activities as told by your health care provider. Ask your health care provider what activities are safe for you.  Take over-the-counter and prescription medicines only as told by your health care provider.  If you smoke, do not smoke without supervision.  Keep all follow-up visits as told by your health care provider. This is important. Contact a health care provider if:  You continue to have  nausea or vomiting at home, and medicines are not helpful.  You cannot drink fluids or start eating again.  You cannot urinate after 8-12 hours.  You develop a skin rash.  You have fever.  You have increasing redness at the site of your procedure. Get help right away if:  You have difficulty  breathing.  You have chest pain.  You have unexpected bleeding.  You feel that you are having a life-threatening or urgent problem. This information is not intended to replace advice given to you by your health care provider. Make sure you discuss any questions you have with your health care provider. Document Released: 06/04/2000 Document Revised: 08/01/2015 Document Reviewed: 02/10/2015 Elsevier Interactive Patient Education  [employer].

## 2017-06-06 ENCOUNTER — Encounter (HOSPITAL_COMMUNITY): Payer: Self-pay

## 2017-06-06 ENCOUNTER — Other Ambulatory Visit: Payer: Self-pay

## 2017-06-06 ENCOUNTER — Encounter (HOSPITAL_COMMUNITY)
Admission: RE | Admit: 2017-06-06 | Discharge: 2017-06-06 | Disposition: A | Discharge status: Home / Self Care | Payer: Medicare HMO | Source: Ambulatory Visit | Attending: General Surgery | Admitting: General Surgery

## 2017-06-06 DIAGNOSIS — Z01812 Encounter for preprocedural laboratory examination: Secondary | ICD-10-CM | POA: Diagnosis present

## 2017-06-06 HISTORY — DX: Major depressive disorder, single episode, unspecified: F32.9

## 2017-06-06 HISTORY — DX: Unspecified glaucoma: H40.9

## 2017-06-06 HISTORY — DX: Sleep apnea, unspecified: G47.30

## 2017-06-06 HISTORY — DX: Depression, unspecified: F32.A

## 2017-06-06 HISTORY — DX: Personal history of urinary calculi: Z87.442

## 2017-06-06 HISTORY — DX: Unspecified osteoarthritis, unspecified site: M19.90

## 2017-06-06 HISTORY — DX: Hypothyroidism, unspecified: E03.9

## 2017-06-06 LAB — BASIC METABOLIC PANEL
Anion gap: 9 (ref 5–15)
BUN: 12 mg/dL (ref 6–20)
CHLORIDE: 102 mmol/L (ref 101–111)
CO2: 26 mmol/L (ref 22–32)
CREATININE: 1.17 mg/dL (ref 0.61–1.24)
Calcium: 9.4 mg/dL (ref 8.9–10.3)
GFR calc Af Amer: >60 mL/min (ref >60)
GFR calc non Af Amer: >60 mL/min (ref >60)
GLUCOSE: 84 mg/dL (ref 65–99)
Potassium: 4.1 mmol/L (ref 3.5–5.1)
Sodium: 137 mmol/L (ref 135–145)

## 2017-06-06 LAB — CBC WITH DIFFERENTIAL/PLATELET
Basophils Absolute: 0.1 10*3/uL (ref 0.0–0.1)
Basophils Relative: 1 %
EOS ABS: 0.4 10*3/uL (ref 0.0–0.7)
EOS PCT: 5 %
HCT: 43 % (ref 39.0–52.0)
Hemoglobin: 14.5 g/dL (ref 13.0–17.0)
LYMPHS ABS: 3.8 10*3/uL (ref 0.7–4.0)
Lymphocytes Relative: 49 %
MCH: 31.7 pg (ref 26.0–34.0)
MCHC: 33.7 g/dL (ref 30.0–36.0)
MCV: 94.1 fL (ref 78.0–100.0)
MONO ABS: 0.6 10*3/uL (ref 0.1–1.0)
Monocytes Relative: 8 %
Neutro Abs: 2.9 10*3/uL (ref 1.7–7.7)
Neutrophils Relative %: 37 %
PLATELETS: 186 10*3/uL (ref 150–400)
RBC: 4.57 MIL/uL (ref 4.22–5.81)
RDW: 12.3 % (ref 11.5–15.5)
WBC: 7.7 10*3/uL (ref 4.0–10.5)

## 2017-06-10 ENCOUNTER — Ambulatory Visit (HOSPITAL_COMMUNITY)
Admission: RE | Admit: 2017-06-10 | Discharge: 2017-06-10 | Disposition: A | Discharge status: Home / Self Care | Payer: Medicare HMO | Source: Ambulatory Visit | Attending: General Surgery | Admitting: General Surgery

## 2017-06-10 ENCOUNTER — Ambulatory Visit (HOSPITAL_COMMUNITY): Payer: Medicare HMO | Admitting: Anesthesiology

## 2017-06-10 ENCOUNTER — Encounter (HOSPITAL_COMMUNITY): Payer: Self-pay

## 2017-06-10 ENCOUNTER — Encounter (HOSPITAL_COMMUNITY)
Admission: RE | Disposition: A | Discharge status: Home / Self Care | Payer: Self-pay | Source: Ambulatory Visit | Attending: General Surgery

## 2017-06-10 DIAGNOSIS — Z79890 Hormone replacement therapy (postmenopausal): Secondary | ICD-10-CM | POA: Insufficient documentation

## 2017-06-10 DIAGNOSIS — F419 Anxiety disorder, unspecified: Secondary | ICD-10-CM | POA: Insufficient documentation

## 2017-06-10 DIAGNOSIS — I10 Essential (primary) hypertension: Secondary | ICD-10-CM | POA: Diagnosis not present

## 2017-06-10 DIAGNOSIS — G4730 Sleep apnea, unspecified: Secondary | ICD-10-CM | POA: Diagnosis not present

## 2017-06-10 DIAGNOSIS — E039 Hypothyroidism, unspecified: Secondary | ICD-10-CM | POA: Diagnosis not present

## 2017-06-10 DIAGNOSIS — K432 Incisional hernia without obstruction or gangrene: Secondary | ICD-10-CM | POA: Diagnosis not present

## 2017-06-10 DIAGNOSIS — Z79899 Other long term (current) drug therapy: Secondary | ICD-10-CM | POA: Diagnosis not present

## 2017-06-10 DIAGNOSIS — R1033 Periumbilical pain: Secondary | ICD-10-CM | POA: Diagnosis present

## 2017-06-10 DIAGNOSIS — R69 Illness, unspecified: Secondary | ICD-10-CM | POA: Diagnosis not present

## 2017-06-10 HISTORY — PX: INCISIONAL HERNIA REPAIR: SHX193

## 2017-06-10 SURGERY — REPAIR, HERNIA, INCISIONAL
Anesthesia: General | Site: Abdomen

## 2017-06-10 MED ORDER — SODIUM CHLORIDE 0.9 % IJ SOLN
INTRAMUSCULAR | Status: AC
  Filled 2017-06-10: qty 10

## 2017-06-10 MED ORDER — HYDROCODONE-ACETAMINOPHEN 5-325 MG PO TABS: 1.0000 | ORAL_TABLET | Freq: Four times a day (QID) | ORAL | 0 refills | Status: DC | PRN

## 2017-06-10 MED ORDER — 0.9 % SODIUM CHLORIDE (POUR BTL) OPTIME
TOPICAL | Status: DC | PRN
  Administered 2017-06-10: 1000 mL

## 2017-06-10 MED ORDER — FENTANYL CITRATE (PF) 100 MCG/2ML IJ SOLN
INTRAMUSCULAR | Status: DC | PRN
  Administered 2017-06-10 (×2): 25 ug via INTRAVENOUS

## 2017-06-10 MED ORDER — CHLORHEXIDINE GLUCONATE CLOTH 2 % EX PADS: 6.0000 | MEDICATED_PAD | Freq: Once | CUTANEOUS | Status: DC

## 2017-06-10 MED ORDER — ONDANSETRON HCL 4 MG/2ML IJ SOLN
INTRAMUSCULAR | Status: DC | PRN
  Administered 2017-06-10: 4 mg via INTRAVENOUS

## 2017-06-10 MED ORDER — GLYCOPYRROLATE 0.2 MG/ML IJ SOLN
INTRAMUSCULAR | Status: AC
  Filled 2017-06-10: qty 1

## 2017-06-10 MED ORDER — HYDROMORPHONE HCL 1 MG/ML IJ SOLN
0.2500 mg | INTRAMUSCULAR | Status: DC | PRN
  Administered 2017-06-10: 0.5 mg via INTRAVENOUS
  Filled 2017-06-10: qty 0.5

## 2017-06-10 MED ORDER — BUPIVACAINE LIPOSOME 1.3 % IJ SUSP
INTRAMUSCULAR | Status: AC
  Filled 2017-06-10: qty 20

## 2017-06-10 MED ORDER — SODIUM CHLORIDE 0.9% FLUSH
INTRAVENOUS | Status: AC
  Filled 2017-06-10: qty 10

## 2017-06-10 MED ORDER — EPHEDRINE SULFATE 50 MG/ML IJ SOLN
INTRAMUSCULAR | Status: DC | PRN
  Administered 2017-06-10 (×2): 10 mg via INTRAVENOUS

## 2017-06-10 MED ORDER — POVIDONE-IODINE 10 % EX OINT
TOPICAL_OINTMENT | CUTANEOUS | Status: AC
  Filled 2017-06-10: qty 1

## 2017-06-10 MED ORDER — LACTATED RINGERS IV SOLN
INTRAVENOUS | Status: DC | PRN
  Administered 2017-06-10: 09:00:00 via INTRAVENOUS

## 2017-06-10 MED ORDER — ONDANSETRON HCL 4 MG/2ML IJ SOLN: 4.0000 mg | Freq: Once | INTRAMUSCULAR | Status: DC | PRN

## 2017-06-10 MED ORDER — PROPOFOL 10 MG/ML IV BOLUS
INTRAVENOUS | Status: DC | PRN
  Administered 2017-06-10: 150 mg via INTRAVENOUS
  Administered 2017-06-10: 50 mg via INTRAVENOUS

## 2017-06-10 MED ORDER — VANCOMYCIN HCL 1000 MG IV SOLR
INTRAVENOUS | Status: DC | PRN
  Administered 2017-06-10: 1000 mg via INTRAVENOUS

## 2017-06-10 MED ORDER — HYDROCODONE-ACETAMINOPHEN 7.5-325 MG PO TABS: 1.0000 | ORAL_TABLET | Freq: Once | ORAL | Status: DC | PRN

## 2017-06-10 MED ORDER — POVIDONE-IODINE 10 % OINT PACKET
TOPICAL_OINTMENT | CUTANEOUS | Status: DC | PRN
  Administered 2017-06-10: 1 via TOPICAL

## 2017-06-10 MED ORDER — LIDOCAINE HCL (CARDIAC) 20 MG/ML IV SOLN
INTRAVENOUS | Status: DC | PRN
  Administered 2017-06-10: 40 mg via INTRAVENOUS

## 2017-06-10 MED ORDER — KETOROLAC TROMETHAMINE 30 MG/ML IJ SOLN
30.0000 mg | Freq: Once | INTRAMUSCULAR | Status: AC
  Administered 2017-06-10: 30 mg via INTRAVENOUS

## 2017-06-10 MED ORDER — DEXTROSE 5 % IV SOLN
INTRAVENOUS | Status: DC | PRN
  Administered 2017-06-10: 09:00:00 via INTRAVENOUS

## 2017-06-10 MED ORDER — MIDAZOLAM HCL 2 MG/2ML IJ SOLN
INTRAMUSCULAR | Status: AC
  Filled 2017-06-10: qty 2

## 2017-06-10 MED ORDER — EPHEDRINE SULFATE 50 MG/ML IJ SOLN
INTRAMUSCULAR | Status: AC
  Filled 2017-06-10: qty 1

## 2017-06-10 MED ORDER — MIDAZOLAM HCL 5 MG/5ML IJ SOLN
INTRAMUSCULAR | Status: DC | PRN
  Administered 2017-06-10 (×2): 1 mg via INTRAVENOUS

## 2017-06-10 MED ORDER — ONDANSETRON HCL 4 MG/2ML IJ SOLN
INTRAMUSCULAR | Status: AC
  Filled 2017-06-10: qty 2

## 2017-06-10 MED ORDER — ATROPINE SULFATE 0.4 MG/ML IJ SOLN
INTRAMUSCULAR | Status: DC | PRN
  Administered 2017-06-10: 0.4 mg via INTRAVENOUS

## 2017-06-10 MED ORDER — VANCOMYCIN HCL IN DEXTROSE 1-5 GM/200ML-% IV SOLN
1000.0000 mg | INTRAVENOUS | Status: DC
  Filled 2017-06-10: qty 200

## 2017-06-10 MED ORDER — KETOROLAC TROMETHAMINE 30 MG/ML IJ SOLN
INTRAMUSCULAR | Status: AC
  Filled 2017-06-10: qty 1

## 2017-06-10 MED ORDER — BUPIVACAINE LIPOSOME 1.3 % IJ SUSP
INTRAMUSCULAR | Status: DC | PRN
  Administered 2017-06-10: 20 mL

## 2017-06-10 MED ORDER — ATROPINE SULFATE 0.4 MG/ML IJ SOLN
INTRAMUSCULAR | Status: AC
  Filled 2017-06-10: qty 2

## 2017-06-10 SURGICAL SUPPLY — 41 items
BAG HAMPER (MISCELLANEOUS) ×2 IMPLANT
BLADE SURG SZ11 CARB STEEL (BLADE) ×2 IMPLANT
CHLORAPREP W/TINT 26ML (MISCELLANEOUS) ×2 IMPLANT
CLOTH BEACON ORANGE TIMEOUT ST (SAFETY) ×2 IMPLANT
COVER LIGHT HANDLE STERIS (MISCELLANEOUS) ×4 IMPLANT
ELECT REM PT RETURN 9FT ADLT (ELECTROSURGICAL) ×2
ELECTRODE REM PT RTRN 9FT ADLT (ELECTROSURGICAL) ×1 IMPLANT
GAUZE SPONGE 4X4 12PLY STRL (GAUZE/BANDAGES/DRESSINGS) ×2 IMPLANT
GLOVE BIOGEL PI IND STRL 7.0 (GLOVE) ×2 IMPLANT
GLOVE BIOGEL PI INDICATOR 7.0 (GLOVE) ×2
GLOVE ECLIPSE 6.5 STRL STRAW (GLOVE) ×2 IMPLANT
GLOVE SURG SS PI 7.5 STRL IVOR (GLOVE) ×2 IMPLANT
GOWN STRL REUS W/ TWL LRG LVL3 (GOWN DISPOSABLE) ×1 IMPLANT
GOWN STRL REUS W/ TWL XL LVL3 (GOWN DISPOSABLE) ×1 IMPLANT
GOWN STRL REUS W/TWL LRG LVL3 (GOWN DISPOSABLE) ×1
GOWN STRL REUS W/TWL XL LVL3 (GOWN DISPOSABLE) ×1
INST SET MAJOR GENERAL (KITS) IMPLANT
KIT TURNOVER KIT A (KITS) ×2 IMPLANT
MANIFOLD NEPTUNE II (INSTRUMENTS) ×2 IMPLANT
MESH VENTRALEX ST 1-7/10 CRC S (Mesh General) ×2 IMPLANT
NEEDLE HYPO 21X1.5 SAFETY (NEEDLE) ×2 IMPLANT
NS IRRIG 1000ML POUR BTL (IV SOLUTION) ×2 IMPLANT
PACK MINOR (CUSTOM PROCEDURE TRAY) ×2 IMPLANT
PAD ARMBOARD 7.5X6 YLW CONV (MISCELLANEOUS) ×2 IMPLANT
SET BASIN LINEN APH (SET/KITS/TRAYS/PACK) ×2 IMPLANT
STAPLER VISISTAT (STAPLE) ×2 IMPLANT
SUT ETHIBOND 0 MO6 C/R (SUTURE) ×2 IMPLANT
SUT MNCRL AB 4-0 PS2 18 (SUTURE) ×2 IMPLANT
SUT NOVA NAB GS-21 1 T12 (SUTURE) IMPLANT
SUT NOVA NAB GS-22 2 2-0 T-19 (SUTURE) IMPLANT
SUT NOVA NAB GS-26 0 60 (SUTURE) IMPLANT
SUT PROLENE 0 CT 1 CR/8 (SUTURE) IMPLANT
SUT SILK 2 0 (SUTURE)
SUT SILK 2-0 18XBRD TIE 12 (SUTURE) IMPLANT
SUT VIC AB 2-0 CT1 27 (SUTURE) ×1
SUT VIC AB 2-0 CT1 TAPERPNT 27 (SUTURE) ×1 IMPLANT
SUT VIC AB 3-0 SH 27 (SUTURE) ×1
SUT VIC AB 3-0 SH 27X BRD (SUTURE) ×1 IMPLANT
SUT VICRYL AB 2 0 TIES (SUTURE) IMPLANT
SYR 20CC LL (SYRINGE) ×2 IMPLANT
TAPE CLOTH SURG 4X10 WHT LF (GAUZE/BANDAGES/DRESSINGS) ×2 IMPLANT

## 2017-06-10 NOTE — Op Note (Signed)
Patient:  John Melendez  DOB:  [birth year]  MRN:  [id number]   Preop Diagnosis: Incisional hernia  Postop Diagnosis: Same  Procedure: Incisional herniorrhaphy with mesh  Surgeon: Aviva Signs, MD  Anes: General  Indications: Patient is a 72 year old white male status post laparoscopic cholecystectomy in the remote past who presents with periumbilical pain.  A small incisional hernia is noted at the base of the umbilicus.  The patient now comes to the operating room for incisional herniorrhaphy with mesh.  The risks and benefits of the procedure including bleeding, infection, mesh use, and the possibility of recurrence of the pain were fully explained to the patient, who gave informed consent.  Procedure note: The patient was placed in supine position.  After general anesthesia was administered, the abdomen was prepped and draped using the usual sterile technique with DuraPrep.  Surgical site confirmation was performed.  The infraumbilical incision was made down to the fascia.  The umbilicus was freed away from the underlying fascia.  The fascia, there was a defect around the base of the umbilicus with scarring from previous surgical intervention.  The small epiplocele of adipose tissue was noted and this was freed away from the abdominal wall and reduced.  The defect was only three quarters of a centimeter in size, thus it was enlarged in order to facilitate mesh placement.  A Bard Ventralax ST 4.3 cm patch was then inserted and secured to the fascia using 0 Ethibond interrupted sutures.  The overlying fascia was closed transversely using 0 Ethibond interrupted sutures.  The base of the umbilicus was secured back to the fascia using a 2-0 Vicryl interrupted suture.  Subcutaneous layer was reapproximated using 3-0 Vicryl interrupted suture.  Exparel was instilled into the surrounding wound.  The skin was closed using staples.  Betadine ointment and dry sterile dressings were applied.  All tape  and needle counts were correct at the end of the procedure.  Patient was awakened and transferred to PACU in stable condition.  Complications: None  EBL: Minimal  Specimen: None

## 2017-06-10 NOTE — Discharge Instructions (Signed)
Open Hernia Repair, Adult, Care After °This sheet gives you information about how to care for yourself after your procedure. Your health care provider may also give you more specific instructions. If you have problems or questions, contact your health care provider. °What can I expect after the procedure? °After the procedure, it is common to have: °· Mild discomfort. °· Slight bruising. °· Minor swelling. °· Pain in the abdomen. ° °Follow these instructions at home: °Incision care ° °· Follow instructions from your health care provider about how to take care of your incision area. Make sure you: °? Wash your hands with soap and water before you change your bandage (dressing). If soap and water are not available, use hand sanitizer. °? Change your dressing as told by your health care provider. °? Leave stitches (sutures), skin glue, or adhesive strips in place. These skin closures may need to stay in place for 2 weeks or longer. If adhesive strip edges start to loosen and curl up, you may trim the loose edges. Do not remove adhesive strips completely unless your health care provider tells you to do that. °· Check your incision area every day for signs of infection. Check for: °? More redness, swelling, or pain. °? More fluid or blood. °? Warmth. °? Pus or a bad smell. °Activity °· Do not drive or use heavy machinery while taking prescription pain medicine. Do not drive until your health care provider approves. °· Until your health care provider approves: °? Do not lift anything that is heavier than 10 lb (4.5 kg). °? Do not play contact sports. °· Return to your normal activities as told by your health care provider. Ask your health care provider what activities are safe. °General instructions °· To prevent or treat constipation while you are taking prescription pain medicine, your health care provider may recommend that you: °? Drink enough fluid to keep your urine clear or pale yellow. °? Take over-the-counter or  prescription medicines. °? Eat foods that are high in fiber, such as fresh fruits and vegetables, whole grains, and beans. °? Limit foods that are high in fat and processed sugars, such as fried and sweet foods. °· Take over-the-counter and prescription medicines only as told by your health care provider. °· Do not take tub baths or go swimming until your health care provider approves. °· Keep all follow-up visits as told by your health care provider. This is important. °Contact a health care provider if: °· You develop a rash. °· You have more redness, swelling, or pain around your incision. °· You have more fluid or blood coming from your incision. °· Your incision feels warm to the touch. °· You have pus or a bad smell coming from your incision. °· You have a fever or chills. °· You have blood in your stool (feces). °· You have not had a bowel movement in 2-3 days. °· Your pain is not controlled with medicine. °Get help right away if: °· You have chest pain or shortness of breath. °· You feel light-headed or feel faint. °· You have severe pain. °· You vomit and your pain is worse. °This information is not intended to replace advice given to you by your health care provider. Make sure you discuss any questions you have with your health care provider. °Document Released: 09/15/2004 Document Revised: 09/16/2015 Document Reviewed: 08/10/2015 °Elsevier Interactive Patient Education © 2018 [employer] ° °General Anesthesia, Adult, Care After °These instructions provide you with information about caring for yourself after   your procedure. Your health care provider may also give you more specific instructions. Your treatment has been planned according to current medical practices, but problems sometimes occur. Call your health care provider if you have any problems or questions after your procedure. °What can I expect after the procedure? °After the procedure, it is common to have: °· Vomiting. °· A sore  throat. °· Mental slowness. ° °It is common to feel: °· Nauseous. °· Cold or shivery. °· Sleepy. °· Tired. °· Sore or achy, even in parts of your body where you did not have surgery. ° °Follow these instructions at home: °For at least 24 hours after the procedure: °· Do not: °? Participate in activities where you could fall or become injured. °? Drive. °? Use heavy machinery. °? Drink alcohol. °? Take sleeping pills or medicines that cause drowsiness. °? Make important decisions or sign legal documents. °? Take care of children on your own. °· Rest. °Eating and drinking °· If you vomit, drink water, juice, or soup when you can drink without vomiting. °· Drink enough fluid to keep your urine clear or pale yellow. °· Make sure you have little or no nausea before eating solid foods. °· Follow the diet recommended by your health care provider. °General instructions °· Have a responsible adult stay with you until you are awake and alert. °· Return to your normal activities as told by your health care provider. Ask your health care provider what activities are safe for you. °· Take over-the-counter and prescription medicines only as told by your health care provider. °· If you smoke, do not smoke without supervision. °· Keep all follow-up visits as told by your health care provider. This is important. °Contact a health care provider if: °· You continue to have nausea or vomiting at home, and medicines are not helpful. °· You cannot drink fluids or start eating again. °· You cannot urinate after 8-12 hours. °· You develop a skin rash. °· You have fever. °· You have increasing redness at the site of your procedure. °Get help right away if: °· You have difficulty breathing. °· You have chest pain. °· You have unexpected bleeding. °· You feel that you are having a life-threatening or urgent problem. °This information is not intended to replace advice given to you by your health care provider. Make sure you discuss any  questions you have with your health care provider. °Document Released: 06/04/2000 Document Revised: 08/01/2015 Document Reviewed: 02/10/2015 °Elsevier Interactive Patient Education © 2018 [employer] ° ° °

## 2017-06-10 NOTE — Anesthesia Procedure Notes (Signed)
Procedure Name: LMA Insertion Date/Time: 06/10/2017 8:53 AM Performed by: Timea Breed A, CRNA Pre-anesthesia Checklist: Patient identified, Emergency Drugs available, Suction available, Patient being monitored and Timeout performed Patient Re-evaluated:Patient Re-evaluated prior to induction Oxygen Delivery Method: Circle system utilized Preoxygenation: Pre-oxygenation with 100% oxygen Induction Type: IV induction Ventilation: Mask ventilation without difficulty LMA: LMA inserted LMA Size: 4.0 Number of attempts: 1 Placement Confirmation: positive ETCO2 Tube secured with: Tape Dental Injury: Teeth and Oropharynx as per pre-operative assessment

## 2017-06-10 NOTE — Interval H&P Note (Signed)
History and Physical Interval Note:  06/10/2017 7:34 AM  John Melendez  has presented today for surgery, with the diagnosis of incisional hernia  The various methods of treatment have been discussed with the patient and family. After consideration of risks, benefits and other options for treatment, the patient has consented to  Procedure(s): HERNIA REPAIR INCISIONAL WITH MESH (N/A) as a surgical intervention .  The patient's history has been reviewed, patient examined, no change in status, stable for surgery.  I have reviewed the patient's chart and labs.  Questions were answered to the patient's satisfaction.     Aviva Signs

## 2017-06-10 NOTE — Anesthesia Postprocedure Evaluation (Signed)
Anesthesia Post Note Late Entry for 1020  Patient: John Melendez  Procedure(s) Performed: HERNIA REPAIR INCISIONAL WITH MESH (N/A Abdomen)  Patient location during evaluation: PACU Anesthesia Type: General Level of consciousness: awake and alert, oriented and patient cooperative Pain management: pain level controlled Vital Signs Assessment: post-procedure vital signs reviewed and stable Respiratory status: spontaneous breathing Cardiovascular status: stable Postop Assessment: no apparent nausea or vomiting Anesthetic complications: no     Last Vitals:  Vitals:   06/10/17 1030 06/10/17 1056  BP: 139/82 138/87  Pulse: 63 68  Resp: 15 16  Temp:  37.1 C  SpO2: 95% 94%    Last Pain:  Vitals:   06/10/17 1056  TempSrc: Oral  PainSc: 3                  Montasia Chisenhall A

## 2017-06-10 NOTE — Anesthesia Preprocedure Evaluation (Signed)
Anesthesia Evaluation  Patient identified by MRN, date of birth, ID band Patient awake    Reviewed: Allergy & Precautions, NPO status , Patient's Chart, lab work & pertinent test results  Airway Mallampati: II  TM Distance: >3 FB Neck ROM: Full    Dental no notable dental hx.    Pulmonary sleep apnea , former smoker,    Pulmonary exam normal breath sounds clear to auscultation       Cardiovascular hypertension, Normal cardiovascular exam Rhythm:Regular Rate:Normal     Neuro/Psych PSYCHIATRIC DISORDERS  Neuromuscular disease    GI/Hepatic hiatal hernia, GERD  ,  Endo/Other  Hypothyroidism   Renal/GU      Musculoskeletal  (+) Arthritis ,   Abdominal   Peds  Hematology   Anesthesia Other Findings   Reproductive/Obstetrics                             Anesthesia Physical Anesthesia Plan  ASA: II  Anesthesia Plan: General   Post-op Pain Management:    Induction: Intravenous  PONV Risk Score and Plan:   Airway Management Planned:   Additional Equipment:   Intra-op Plan:   Post-operative Plan: Extubation in OR  Informed Consent: I have reviewed the patients History and Physical, chart, labs and discussed the procedure including the risks, benefits and alternatives for the proposed anesthesia with the patient or authorized representative who has indicated his/her understanding and acceptance.   Dental advisory given  Plan Discussed with: CRNA  Anesthesia Plan Comments:         Anesthesia Quick Evaluation

## 2017-06-10 NOTE — Transfer of Care (Signed)
Immediate Anesthesia Transfer of Care Note  Patient: John Melendez  Procedure(s) Performed: HERNIA REPAIR INCISIONAL WITH MESH (N/A Abdomen)  Patient Location: PACU  Anesthesia Type:General  Level of Consciousness: awake, oriented and patient cooperative  Airway & Oxygen Therapy: Patient Spontanous Breathing  Post-op Assessment: Report given to RN and Post -op Vital signs reviewed and stable  Post vital signs: Reviewed and stable  Last Vitals:  Vitals Value Taken Time  BP 143/92 06/10/2017  9:41 AM  Temp    Pulse 80 06/10/2017  9:42 AM  Resp 15 06/10/2017  9:42 AM  SpO2 98 % 06/10/2017  9:42 AM  Vitals shown include unvalidated device data.  Last Pain:  Vitals:   06/10/17 0743  TempSrc: Oral  PainSc: 0-No pain         Complications: No apparent anesthesia complications

## 2017-06-11 ENCOUNTER — Encounter (HOSPITAL_COMMUNITY): Payer: Self-pay | Admitting: General Surgery

## 2017-06-20 ENCOUNTER — Encounter: Payer: Self-pay | Admitting: General Surgery

## 2017-06-20 ENCOUNTER — Ambulatory Visit (INDEPENDENT_AMBULATORY_CARE_PROVIDER_SITE_OTHER): Payer: Self-pay | Admitting: General Surgery

## 2017-06-20 VITALS — BP 154/85 | HR 59 | Temp 98.2°F | Ht 72.0 in | Wt 194.0 lb

## 2017-06-20 DIAGNOSIS — Z09 Encounter for follow-up examination after completed treatment for conditions other than malignant neoplasm: Secondary | ICD-10-CM

## 2017-06-20 NOTE — Progress Notes (Signed)
Subjective:     John Melendez  Status post incisional herniorrhaphy with mesh.  Patient is having incisional pain, 5 out of 10.  No fever or chills are noted.  He is starting to increase his activity. Objective:    BP (!) 154/85   Pulse (!) 59   Temp 98.2 F (36.8 C)   Ht 6' (1.829 m)   Wt 194 lb (88 kg)   BMI 26.31 kg/m   General:  alert, cooperative and no distress  Abdomen soft, incision healing well.  Staples removed, Steri-Strips applied.     Assessment:    Mild incisional pain secondary to increased activity.  No hernia appreciated.    Plan:   I reassured the patient that this was normal for his surgery.  Ibuprofen will help with this pain.  We will see the patient in 2 weeks for follow-up.  Avoid any significant heavy lifting.

## 2017-06-28 DIAGNOSIS — R69 Illness, unspecified: Secondary | ICD-10-CM | POA: Diagnosis not present

## 2017-06-28 DIAGNOSIS — R5383 Other fatigue: Secondary | ICD-10-CM | POA: Diagnosis not present

## 2017-06-28 DIAGNOSIS — Z0000 Encounter for general adult medical examination without abnormal findings: Secondary | ICD-10-CM | POA: Diagnosis not present

## 2017-06-28 DIAGNOSIS — E063 Autoimmune thyroiditis: Secondary | ICD-10-CM | POA: Diagnosis not present

## 2017-06-28 DIAGNOSIS — Z6825 Body mass index (BMI) 25.0-25.9, adult: Secondary | ICD-10-CM | POA: Diagnosis not present

## 2017-07-04 ENCOUNTER — Ambulatory Visit: Payer: Self-pay | Admitting: General Surgery

## 2017-07-23 ENCOUNTER — Encounter: Payer: Self-pay | Admitting: General Surgery

## 2017-07-23 ENCOUNTER — Ambulatory Visit (INDEPENDENT_AMBULATORY_CARE_PROVIDER_SITE_OTHER): Payer: Self-pay | Admitting: General Surgery

## 2017-07-23 VITALS — BP 138/78 | HR 66 | Temp 98.7°F | Resp 20 | Wt 201.0 lb

## 2017-07-23 DIAGNOSIS — E663 Overweight: Secondary | ICD-10-CM | POA: Diagnosis not present

## 2017-07-23 DIAGNOSIS — S63591A Other specified sprain of right wrist, initial encounter: Secondary | ICD-10-CM | POA: Diagnosis not present

## 2017-07-23 DIAGNOSIS — M189 Osteoarthritis of first carpometacarpal joint, unspecified: Secondary | ICD-10-CM | POA: Diagnosis not present

## 2017-07-23 DIAGNOSIS — Z6826 Body mass index (BMI) 26.0-26.9, adult: Secondary | ICD-10-CM | POA: Diagnosis not present

## 2017-07-23 DIAGNOSIS — Z09 Encounter for follow-up examination after completed treatment for conditions other than malignant neoplasm: Secondary | ICD-10-CM

## 2017-07-23 NOTE — Progress Notes (Signed)
Subjective:     John Melendez  Patient presents with recurrence of the chronic pain that he has had for approximately 4 years.  He also said he was contacted by a lawyer concerning possible litigation against the company that makes the mesh that was used. Objective:    BP 138/78 (BP Location: Left Arm, Patient Position: Sitting, Cuff Size: Normal)   Pulse 66   Temp 98.7 F (37.1 C) (Temporal)   Resp 20   Wt 201 lb (91.2 kg)   BMI 27.26 kg/m   General:  alert, cooperative and no distress  Abdomen is soft, incision well-healed.  No evidence of recurrence of the hernias present.     Assessment:     Chronic abdominal pain of unknown etiology   Plan:   Patient states that he knew that I had told him preoperatively that this may not cure him of his chronic abdominal pain.  He has had an extensive work-up and the only thing that was found was a small umbilical hernia.  I told him that the mesh that was placed has not been recalled by the FDA.  That his pain is the same as it was preoperatively does suggest that this is not related to the hernia repair itself.  He understands this.  Follow-up as needed.

## 2017-08-28 DIAGNOSIS — W57XXXA Bitten or stung by nonvenomous insect and other nonvenomous arthropods, initial encounter: Secondary | ICD-10-CM | POA: Diagnosis not present

## 2017-08-28 DIAGNOSIS — M5030 Other cervical disc degeneration, unspecified cervical region: Secondary | ICD-10-CM | POA: Diagnosis not present

## 2017-08-28 DIAGNOSIS — Z6826 Body mass index (BMI) 26.0-26.9, adult: Secondary | ICD-10-CM | POA: Diagnosis not present

## 2017-09-16 DIAGNOSIS — M791 Myalgia, unspecified site: Secondary | ICD-10-CM | POA: Diagnosis not present

## 2017-09-25 ENCOUNTER — Other Ambulatory Visit (HOSPITAL_COMMUNITY): Payer: Self-pay | Admitting: Physician Assistant

## 2017-09-25 ENCOUNTER — Ambulatory Visit (HOSPITAL_COMMUNITY)
Admission: RE | Admit: 2017-09-25 | Discharge: 2017-09-25 | Disposition: A | Discharge status: Home / Self Care | Payer: Medicare HMO | Source: Ambulatory Visit | Attending: Physician Assistant | Admitting: Physician Assistant

## 2017-09-25 DIAGNOSIS — M5030 Other cervical disc degeneration, unspecified cervical region: Secondary | ICD-10-CM | POA: Insufficient documentation

## 2017-09-25 DIAGNOSIS — M542 Cervicalgia: Secondary | ICD-10-CM | POA: Diagnosis not present

## 2017-10-01 DIAGNOSIS — Z024 Encounter for examination for driving license: Secondary | ICD-10-CM | POA: Diagnosis not present

## 2017-10-21 DIAGNOSIS — H25811 Combined forms of age-related cataract, right eye: Secondary | ICD-10-CM | POA: Diagnosis not present

## 2017-10-21 DIAGNOSIS — H25812 Combined forms of age-related cataract, left eye: Secondary | ICD-10-CM | POA: Diagnosis not present

## 2017-10-21 DIAGNOSIS — H401132 Primary open-angle glaucoma, bilateral, moderate stage: Secondary | ICD-10-CM | POA: Diagnosis not present

## 2017-11-05 ENCOUNTER — Emergency Department (HOSPITAL_COMMUNITY)
Admission: EM | Admit: 2017-11-05 | Discharge: 2017-11-05 | Disposition: A | Discharge status: Home / Self Care | Payer: Worker's Compensation | Attending: Emergency Medicine | Admitting: Emergency Medicine

## 2017-11-05 ENCOUNTER — Emergency Department (HOSPITAL_COMMUNITY): Payer: Worker's Compensation

## 2017-11-05 ENCOUNTER — Encounter (HOSPITAL_COMMUNITY): Payer: Self-pay | Admitting: *Deleted

## 2017-11-05 DIAGNOSIS — E039 Hypothyroidism, unspecified: Secondary | ICD-10-CM | POA: Insufficient documentation

## 2017-11-05 DIAGNOSIS — M25571 Pain in right ankle and joints of right foot: Secondary | ICD-10-CM | POA: Diagnosis not present

## 2017-11-05 DIAGNOSIS — I10 Essential (primary) hypertension: Secondary | ICD-10-CM | POA: Diagnosis not present

## 2017-11-05 DIAGNOSIS — Z87891 Personal history of nicotine dependence: Secondary | ICD-10-CM | POA: Diagnosis not present

## 2017-11-05 DIAGNOSIS — Z79899 Other long term (current) drug therapy: Secondary | ICD-10-CM | POA: Insufficient documentation

## 2017-11-05 MED ORDER — ACETAMINOPHEN 325 MG PO TABS
650.0000 mg | ORAL_TABLET | Freq: Once | ORAL | Status: AC
  Administered 2017-11-05: 650 mg via ORAL
  Filled 2017-11-05: qty 2

## 2017-11-05 NOTE — ED Provider Notes (Signed)
[facility] EMERGENCY DEPARTMENT Provider Note   CSN: [id number] Arrival date & time: 11/05/17  1158     History   Chief Complaint Chief Complaint  Patient presents with  . Fall    HPI John Melendez is a 72 y.o. male with history of hypertension, hypothyroidism, GERD who presents with right ankle pain after slipping and falling on Monday.  Patient reports he caught himself and fell on his bottom.  He did not hit his head or lose consciousness.  He reports some right-sided low back pain, but he describes this as mild.  He reports history of chronic neck pain and some flare of this from the fall on the left side.  He is not anticoagulated.  He reports most of his pain is in his right ankle.  He has some tingling to the area.  No medications taken prior to arrival.  HPI  Past Medical History:  Diagnosis Date  . Anxiety   . Arthritis   . Depression   . GERD (gastroesophageal reflux disease)    no PPI currently, symptoms controlled with diet and behavior  . Glaucoma   . History of kidney stones   . Hypertension   . Hypothyroidism   . Sleep apnea     Patient Active Problem List   Diagnosis Date Noted  . Incisional hernia, without obstruction or gangrene   . History of colonic polyps   . Diverticulosis of colon without hemorrhage   . Hiatal hernia   . Abdominal pain, epigastric 01/26/2014  . Encounter for screening colonoscopy 10/27/2013    Past Surgical History:  Procedure Laterality Date  . BACK SURGERY     ruptured lumbar disc.  Marland Kitchen CHOLECYSTECTOMY    . COLONOSCOPY  02/15/14   NAT:FTDD papilla internal hemorrhoids/colonic diverticulosis/multiple colonic polyp  . ESOPHAGOGASTRODUODENOSCOPY  2004   Dr. Gala Romney: non-critical Schatzki's ring s/p dilation with 47 F, small hiatal hernia, normal D1 and D2   . ESOPHAGOGASTRODUODENOSCOPY  02/15/14   UK[facility]:[id number] schatzki's ring not manipulated/hiatal hernia  . INCISIONAL HERNIA REPAIR N/A 06/10/2017   Procedure: HERNIA REPAIR  INCISIONAL WITH MESH;  Surgeon: Aviva Signs, MD;  Location: [facility];  Service: General;  Laterality: N/A;  . SLT LASER APPLICATION Right 04/19/3149   Procedure: SLT LASER APPLICATION;  Surgeon: Williams Che, MD;  Location: [facility];  Service: Ophthalmology;  Laterality: Right;        Home Medications    Prior to Admission medications   Medication Sig Start Date End Date Taking? Authorizing Provider  ALPRAZolam Duanne Moron) 0.5 MG tablet Take 0.5 mg by mouth 3 (three) times daily as needed.    [provider]  brimonidine (ALPHAGAN) 0.2 % ophthalmic solution 2 (two) times daily.    [provider]  calcium carbonate (TUMS EX) 750 MG chewable tablet Chew 1 tablet by mouth daily.    [provider]  cyclobenzaprine (FLEXERIL) 10 MG tablet Take 1 tablet (10 mg total) by mouth at bedtime and may repeat dose one time if needed. 01/09/16   Recardo Evangelist, PA-C  cyclobenzaprine (FLEXERIL) 10 MG tablet Take 1 tablet (10 mg total) by mouth at bedtime. 01/11/16   Sanjuana Kava, MD  FLUoxetine (PROZAC) 20 MG tablet Take 20 mg by mouth daily.    [provider]  HYDROcodone-acetaminophen (NORCO) 5-325 MG tablet Take 1 tablet by mouth every 6 (six) hours as needed for moderate pain. 06/10/17   Aviva Signs, MD  latanoprost (XALATAN) 0.005 % ophthalmic solution 1  drop at bedtime.    [provider]  Levothyroxine Sodium 88 MCG CAPS Take 88 mcg by mouth daily before breakfast.    [provider]  lisinopril (PRINIVIL,ZESTRIL) 10 MG tablet Take 10 mg by mouth daily.    [provider]  loratadine (CLARITIN) 10 MG tablet Take 10 mg by mouth daily.    [provider]  Multiple Vitamins-Minerals (ALIVE ENERGY 50+ PO) Take 1 tablet by mouth daily.    [provider]  pantoprazole (PROTONIX) 40 MG tablet TAKE 1 TABLET ONCE A DAY 30 MINUTES BEFORE BREAKFAST. 06/16/14   Mahala Menghini, PA-C  polyethylene glycol-electrolytes  (NULYTELY/GOLYTELY) 420 G solution Take 4,000 mLs by mouth once. Patient not taking: Reported on 05/14/2017 02/14/14   Rogene Houston, MD  timolol (BETIMOL) 0.5 % ophthalmic solution 1 drop 2 (two) times daily.    [provider]    Family History Family History  Problem Relation Age of Onset  . Colon cancer Neg Hx     Social History Social History   Tobacco Use  . Smoking status: Former Smoker    Packs/day: 3.00    Years: 34.00    Pack years: 102.00    Last attempt to quit: 06/06/1989    Years since quitting: 28.4  . Smokeless tobacco: Never Used  Substance Use Topics  . Alcohol use: No  . Drug use: No     Allergies   Penicillins   Review of Systems Review of Systems  Musculoskeletal: Positive for arthralgias, back pain and neck pain.  Neurological: Negative for numbness.     Physical Exam Updated Vital Signs BP (!) 148/75 (BP Location: Right Arm)   Pulse 65   Temp (!) 97.5 F (36.4 C) (Oral)   Resp 18   Ht 6\' 1"  (1.854 m)   Wt 88.5 kg   SpO2 97%   BMI 25.73 kg/m   Physical Exam  Constitutional: He appears well-developed and well-nourished. No distress.  HENT:  Head: Normocephalic and atraumatic.  Mouth/Throat: Oropharynx is clear and moist. No oropharyngeal exudate.  Eyes: Pupils are equal, round, and reactive to light. Conjunctivae are normal. Right eye exhibits no discharge. Left eye exhibits no discharge. No scleral icterus.  Neck: Normal range of motion. Neck supple. No thyromegaly present.  Cardiovascular: Normal rate, regular rhythm, normal heart sounds and intact distal pulses. Exam reveals no gallop and no friction rub.  No murmur heard. Pulmonary/Chest: Effort normal and breath sounds normal. No stridor. No respiratory distress. He has no wheezes. He has no rales.  Abdominal: Soft. Bowel sounds are normal. He exhibits no distension. There is no tenderness. There is no rebound and no guarding.  Musculoskeletal: He exhibits no edema.  No  midline cervical, thoracic, or lumbar tenderness Some left-sided cervical paraspinal tenderness and right-sided lumbar paraspinal tenderness Swelling over the right lateral malleolus with tenderness DP pulses intact with normal sensation; range of motion limited due to pain and swelling with plantar dorsiflexion of the right ankle No proximal fibular tenderness or knee tenderness.  Lymphadenopathy:    He has no cervical adenopathy.  Neurological: He is alert. Coordination normal.  CN 3-12 intact; normal sensation throughout; 5/5 strength in all 4 extremities; equal bilateral grip strength  Skin: Skin is warm and dry. No rash noted. He is not diaphoretic. No pallor.  Psychiatric: He has a normal mood and affect.  Nursing note and vitals reviewed.    ED Treatments / Results  Labs (all labs ordered are  listed, but only abnormal results are displayed) Labs Reviewed - No data to display  EKG None  Radiology Dg Ankle Complete Right  Result Date: 11/05/2017 CLINICAL DATA:  72 year old with right ankle pain after slip and fall. EXAM: RIGHT ANKLE - COMPLETE 3+ VIEW COMPARISON:  None. FINDINGS: Negative for an acute fracture or dislocation. Large loose bodies along the medial malleolus appear chronic. Large plantar calcaneal spur. Ossifications along the plantar fascia. Degenerative changes at the talonavicular joint. Mild lateral soft tissue swelling. IMPRESSION: No acute bone abnormality in the right ankle. Electronically Signed   By: Markus Daft M.D.   On: 11/05/2017 13:20    Procedures Procedures (including critical care time)  Medications Ordered in ED Medications  acetaminophen (TYLENOL) tablet 650 mg (650 mg Oral Given 11/05/17 1301)     Initial Impression / Assessment and Plan / ED Course  I have reviewed the triage vital signs and the nursing notes.  Pertinent labs & imaging results that were available during my care of the patient were reviewed by me and considered in my  medical decision making (see chart for details).     X-ray of the right ankle shows no acute bony abnormality.  Suspect ankle sprain.  No indication for imaging of the lumbar spine.  Patient offered CT cervical spine considering history of cervical pain, however patient did not want to have it today.  He reports he will follow-up with his doctor as needed for it.  Will treat with supportive treatment including ibuprofen, Tylenol, ice, elevation.  ASO splint and crutches given in the ED.  I stressed the importance of careful ambulation with crutches.  Patient counseled on partial weightbearing as tolerated until full weightbearing.  Patient understands and agrees with plan.  Patient vitals stable throughout ED course and discharged in satisfactory condition.  Final Clinical Impressions(s) / ED Diagnoses   Final diagnoses:  Acute right ankle pain    ED Discharge Orders    None       Frederica Kuster, PA-C 11/05/17 1418    Mesner, Corene Cornea, MD 11/05/17 1443

## 2017-11-05 NOTE — Discharge Instructions (Signed)
Medications: Ibuprofen, Tylenol  Treatment: Take ibuprofen as prescribed over-the-counter, as needed to help with pain and swelling. You can alternate with Tylenol for breakthrough pain 4 hours after taking ibuprofen. Use ice 3-4 times daily alternating 20 minutes on, 20 minutes off. Keep your leg elevated whenever you're not walking on it. Wear your splint at all times except when bathing. Use crutches at all times initially and begin partial weightbearing as tolerated.  Follow-up: Please follow-up with your doctor if your symptoms are not improving over the next 7-10 days. Please return to the emergency department if you develop any new or worsening symptoms.

## 2017-11-05 NOTE — ED Triage Notes (Addendum)
Pt slipped in some mud this morning at work, c/o neck pain that is worse, lower back pain as well.  Pt denies hitting his head. Pt c/o right ankle pain as well due to fall.

## 2017-11-13 ENCOUNTER — Other Ambulatory Visit: Payer: Self-pay

## 2017-11-13 ENCOUNTER — Encounter (HOSPITAL_COMMUNITY): Payer: Self-pay | Admitting: Emergency Medicine

## 2017-11-13 ENCOUNTER — Emergency Department (HOSPITAL_COMMUNITY)
Admission: EM | Admit: 2017-11-13 | Discharge: 2017-11-13 | Disposition: A | Discharge status: Home / Self Care | Payer: Worker's Compensation | Attending: Emergency Medicine | Admitting: Emergency Medicine

## 2017-11-13 DIAGNOSIS — Y9259 Other trade areas as the place of occurrence of the external cause: Secondary | ICD-10-CM | POA: Insufficient documentation

## 2017-11-13 DIAGNOSIS — S93401A Sprain of unspecified ligament of right ankle, initial encounter: Secondary | ICD-10-CM | POA: Diagnosis not present

## 2017-11-13 DIAGNOSIS — Y939 Activity, unspecified: Secondary | ICD-10-CM | POA: Diagnosis not present

## 2017-11-13 DIAGNOSIS — X58XXXA Exposure to other specified factors, initial encounter: Secondary | ICD-10-CM | POA: Diagnosis not present

## 2017-11-13 DIAGNOSIS — Y990 Civilian activity done for income or pay: Secondary | ICD-10-CM | POA: Insufficient documentation

## 2017-11-13 DIAGNOSIS — E039 Hypothyroidism, unspecified: Secondary | ICD-10-CM | POA: Insufficient documentation

## 2017-11-13 DIAGNOSIS — Z87891 Personal history of nicotine dependence: Secondary | ICD-10-CM | POA: Insufficient documentation

## 2017-11-13 DIAGNOSIS — Z79899 Other long term (current) drug therapy: Secondary | ICD-10-CM | POA: Insufficient documentation

## 2017-11-13 DIAGNOSIS — I10 Essential (primary) hypertension: Secondary | ICD-10-CM | POA: Insufficient documentation

## 2017-11-13 DIAGNOSIS — S99911A Unspecified injury of right ankle, initial encounter: Secondary | ICD-10-CM | POA: Diagnosis present

## 2017-11-13 NOTE — Discharge Instructions (Signed)
Elevate foot,

## 2017-11-13 NOTE — ED Triage Notes (Signed)
Pt states he continues to have R ankle pain after 10 days from being seen here. States he saw a Chief Executive Officer today for [employer] and was told to come back to ED.

## 2017-11-14 NOTE — ED Provider Notes (Signed)
[facility] EMERGENCY DEPARTMENT Provider Note   CSN: [id number] Arrival date & time: 11/13/17  1913     History   Chief Complaint Chief Complaint  Patient presents with  . Ankle Pain    HPI John Melendez is a 72 y.o. male.  The history is provided by the patient. No language interpreter was used.  Ankle Pain   The incident occurred more than 1 week ago. The incident occurred at work. The injury mechanism was torsion. The pain is present in the right ankle. The quality of the pain is described as aching. The pain is moderate. The pain has been constant since onset. Pertinent negatives include no numbness. He reports no foreign bodies present. Nothing aggravates the symptoms. He has tried nothing for the symptoms. The treatment provided no relief.  Pt complains of pain in his ankle since injuring at work a week ago.  Pt reports toes are now purple and swollen  Past Medical History:  Diagnosis Date  . Anxiety   . Arthritis   . Depression   . GERD (gastroesophageal reflux disease)    no PPI currently, symptoms controlled with diet and behavior  . Glaucoma   . History of kidney stones   . Hypertension   . Hypothyroidism   . Sleep apnea     Patient Active Problem List   Diagnosis Date Noted  . Incisional hernia, without obstruction or gangrene   . History of colonic polyps   . Diverticulosis of colon without hemorrhage   . Hiatal hernia   . Abdominal pain, epigastric 01/26/2014  . Encounter for screening colonoscopy 10/27/2013    Past Surgical History:  Procedure Laterality Date  . BACK SURGERY     ruptured lumbar disc.  Marland Kitchen CHOLECYSTECTOMY    . COLONOSCOPY  02/15/14   XNA:TFTD papilla internal hemorrhoids/colonic diverticulosis/multiple colonic polyp  . ESOPHAGOGASTRODUODENOSCOPY  2004   Dr. Gala Romney: non-critical Schatzki's ring s/p dilation with 79 F, small hiatal hernia, normal D1 and D2   . ESOPHAGOGASTRODUODENOSCOPY  02/15/14   [id number] schatzki's ring not  manipulated/hiatal hernia  . INCISIONAL HERNIA REPAIR N/A 06/10/2017   Procedure: HERNIA REPAIR INCISIONAL WITH MESH;  Surgeon: Aviva Signs, MD;  Location: [facility];  Service: General;  Laterality: N/A;  . SLT LASER APPLICATION Right 08/11/8313   Procedure: SLT LASER APPLICATION;  Surgeon: Williams Che, MD;  Location: [facility];  Service: Ophthalmology;  Laterality: Right;        Home Medications    Prior to Admission medications   Medication Sig Start Date End Date Taking? Authorizing Provider  ALPRAZolam Duanne Moron) 0.5 MG tablet Take 0.5 mg by mouth 3 (three) times daily as needed.    [provider]  brimonidine (ALPHAGAN) 0.2 % ophthalmic solution 2 (two) times daily.    [provider]  calcium carbonate (TUMS EX) 750 MG chewable tablet Chew 1 tablet by mouth daily.    [provider]  cyclobenzaprine (FLEXERIL) 10 MG tablet Take 1 tablet (10 mg total) by mouth at bedtime and may repeat dose one time if needed. 01/09/16   Recardo Evangelist, PA-C  cyclobenzaprine (FLEXERIL) 10 MG tablet Take 1 tablet (10 mg total) by mouth at bedtime. 01/11/16   Sanjuana Kava, MD  FLUoxetine (PROZAC) 20 MG tablet Take 20 mg by mouth daily.    [provider]  HYDROcodone-acetaminophen (NORCO) 5-325 MG tablet Take 1 tablet by mouth every 6 (six) hours as needed for moderate pain. 06/10/17   Aviva Signs,  MD  latanoprost (XALATAN) 0.005 % ophthalmic solution 1 drop at bedtime.    [provider]  Levothyroxine Sodium 88 MCG CAPS Take 88 mcg by mouth daily before breakfast.    [provider]  lisinopril (PRINIVIL,ZESTRIL) 10 MG tablet Take 10 mg by mouth daily.    [provider]  loratadine (CLARITIN) 10 MG tablet Take 10 mg by mouth daily.    [provider]  Multiple Vitamins-Minerals (ALIVE ENERGY 50+ PO) Take 1 tablet by mouth daily.    [provider]  pantoprazole (PROTONIX) 40 MG tablet TAKE 1 TABLET ONCE A DAY 30 MINUTES  BEFORE BREAKFAST. 06/16/14   Mahala Menghini, PA-C  polyethylene glycol-electrolytes (NULYTELY/GOLYTELY) 420 G solution Take 4,000 mLs by mouth once. Patient not taking: Reported on 05/14/2017 02/14/14   Rogene Houston, MD  timolol (BETIMOL) 0.5 % ophthalmic solution 1 drop 2 (two) times daily.    [provider]    Family History Family History  Problem Relation Age of Onset  . Colon cancer Neg Hx     Social History Social History   Tobacco Use  . Smoking status: Former Smoker    Packs/day: 3.00    Years: 34.00    Pack years: 102.00    Last attempt to quit: 06/06/1989    Years since quitting: 28.4  . Smokeless tobacco: Never Used  Substance Use Topics  . Alcohol use: No  . Drug use: No     Allergies   Penicillins   Review of Systems Review of Systems  Musculoskeletal: Positive for joint swelling and myalgias.  Neurological: Negative for numbness.  All other systems reviewed and are negative.    Physical Exam Updated Vital Signs BP (!) 161/94 (BP Location: Right Arm)   Pulse 62   Temp 98.6 F (37 C) (Oral)   Resp 20   Ht 6\' 1"  (1.854 m)   Wt 88.5 kg   SpO2 98%   BMI 25.73 kg/m   Physical Exam  Constitutional: He appears well-developed and well-nourished.  Musculoskeletal: He exhibits tenderness.  Bruised ankle and foot,  Swelling lateral ankle,  nv and ns intact   Neurological: He is alert.  Skin: Skin is warm.  Psychiatric: He has a normal mood and affect.  Nursing note and vitals reviewed.    ED Treatments / Results  Labs (all labs ordered are listed, but only abnormal results are displayed) Labs Reviewed - No data to display  EKG None  Radiology No results found.  Procedures Procedures (including critical care time)  Medications Ordered in ED Medications - No data to display   Initial Impression / Assessment and Plan / ED Course  I have reviewed the triage vital signs and the nursing notes.  Pertinent labs & imaging results  that were available during my care of the patient were reviewed by me and considered in my medical decision making (see chart for details).     MDM  Pt placed in a cam walker,  Pt advised to elevate foot,  Call Dr. Ruthe Mannan office to schedule to be seen for evaluation  Final Clinical Impressions(s) / ED Diagnoses   Final diagnoses:  Sprain of right ankle, unspecified ligament, initial encounter    ED Discharge Orders    None    An After Visit Summary was printed and given to the patient.    Fransico Meadow, PA-C 11/14/17 0037    Carmin Muskrat, MD 11/17/17 [phone]

## 2017-11-18 ENCOUNTER — Telehealth: Payer: Self-pay | Admitting: Orthopaedic Surgery

## 2017-11-18 NOTE — Telephone Encounter (Signed)
Patient called this afternoon stating he went to ER twice for an ankle injury.  He has seen Dr. Luna Glasgow in the past.  I asked the patient what kind of insurance would we be filing and he stated this would be covered under WC.  I told him that I could not schedule a WC appointment with him that he would have to have someone from the [facility] insurance to set this up.  He said his employer did not talk to him about getting it approved through [facility] and that he now has a Chief Executive Officer.  I told him that maybe he should at least speak to his employer to see if they spoke to their [facility] insurance adjustors and where this stood with them or speak to his attorney to see how to proceed.  He said he would do this and call us back.

## 2017-11-29 DIAGNOSIS — H01024 Squamous blepharitis left upper eyelid: Secondary | ICD-10-CM | POA: Diagnosis not present

## 2017-11-29 DIAGNOSIS — H01022 Squamous blepharitis right lower eyelid: Secondary | ICD-10-CM | POA: Diagnosis not present

## 2017-11-29 DIAGNOSIS — H01021 Squamous blepharitis right upper eyelid: Secondary | ICD-10-CM | POA: Diagnosis not present

## 2017-11-29 DIAGNOSIS — H01025 Squamous blepharitis left lower eyelid: Secondary | ICD-10-CM | POA: Diagnosis not present

## 2017-12-06 DIAGNOSIS — H10413 Chronic giant papillary conjunctivitis, bilateral: Secondary | ICD-10-CM | POA: Diagnosis not present

## 2017-12-06 DIAGNOSIS — H01131 Eczematous dermatitis of right upper eyelid: Secondary | ICD-10-CM | POA: Diagnosis not present

## 2017-12-19 DIAGNOSIS — R69 Illness, unspecified: Secondary | ICD-10-CM | POA: Diagnosis not present

## 2017-12-26 DIAGNOSIS — H25811 Combined forms of age-related cataract, right eye: Secondary | ICD-10-CM | POA: Diagnosis not present

## 2017-12-26 DIAGNOSIS — H25812 Combined forms of age-related cataract, left eye: Secondary | ICD-10-CM | POA: Diagnosis not present

## 2017-12-26 DIAGNOSIS — H01024 Squamous blepharitis left upper eyelid: Secondary | ICD-10-CM | POA: Diagnosis not present

## 2017-12-26 DIAGNOSIS — H401133 Primary open-angle glaucoma, bilateral, severe stage: Secondary | ICD-10-CM | POA: Diagnosis not present

## 2017-12-26 DIAGNOSIS — H01025 Squamous blepharitis left lower eyelid: Secondary | ICD-10-CM | POA: Diagnosis not present

## 2017-12-26 DIAGNOSIS — H01022 Squamous blepharitis right lower eyelid: Secondary | ICD-10-CM | POA: Diagnosis not present

## 2017-12-26 DIAGNOSIS — H01021 Squamous blepharitis right upper eyelid: Secondary | ICD-10-CM | POA: Diagnosis not present

## 2018-01-27 DIAGNOSIS — Z6827 Body mass index (BMI) 27.0-27.9, adult: Secondary | ICD-10-CM | POA: Diagnosis not present

## 2018-01-27 DIAGNOSIS — E663 Overweight: Secondary | ICD-10-CM | POA: Diagnosis not present

## 2018-01-27 DIAGNOSIS — Z0001 Encounter for general adult medical examination with abnormal findings: Secondary | ICD-10-CM | POA: Diagnosis not present

## 2018-01-27 DIAGNOSIS — R69 Illness, unspecified: Secondary | ICD-10-CM | POA: Diagnosis not present

## 2018-01-27 DIAGNOSIS — Z1389 Encounter for screening for other disorder: Secondary | ICD-10-CM | POA: Diagnosis not present

## 2018-01-31 DIAGNOSIS — H01025 Squamous blepharitis left lower eyelid: Secondary | ICD-10-CM | POA: Diagnosis not present

## 2018-01-31 DIAGNOSIS — H01022 Squamous blepharitis right lower eyelid: Secondary | ICD-10-CM | POA: Diagnosis not present

## 2018-01-31 DIAGNOSIS — H25811 Combined forms of age-related cataract, right eye: Secondary | ICD-10-CM | POA: Diagnosis not present

## 2018-01-31 DIAGNOSIS — H01024 Squamous blepharitis left upper eyelid: Secondary | ICD-10-CM | POA: Diagnosis not present

## 2018-01-31 DIAGNOSIS — H01021 Squamous blepharitis right upper eyelid: Secondary | ICD-10-CM | POA: Diagnosis not present

## 2018-01-31 DIAGNOSIS — H401133 Primary open-angle glaucoma, bilateral, severe stage: Secondary | ICD-10-CM | POA: Diagnosis not present

## 2018-01-31 DIAGNOSIS — H25812 Combined forms of age-related cataract, left eye: Secondary | ICD-10-CM | POA: Diagnosis not present

## 2018-04-18 DIAGNOSIS — J3089 Other allergic rhinitis: Secondary | ICD-10-CM | POA: Diagnosis not present

## 2018-04-18 DIAGNOSIS — E063 Autoimmune thyroiditis: Secondary | ICD-10-CM | POA: Diagnosis not present

## 2018-04-18 DIAGNOSIS — Z6826 Body mass index (BMI) 26.0-26.9, adult: Secondary | ICD-10-CM | POA: Diagnosis not present

## 2018-04-18 DIAGNOSIS — H6123 Impacted cerumen, bilateral: Secondary | ICD-10-CM | POA: Diagnosis not present

## 2018-04-18 DIAGNOSIS — H9313 Tinnitus, bilateral: Secondary | ICD-10-CM | POA: Diagnosis not present

## 2018-04-18 DIAGNOSIS — R69 Illness, unspecified: Secondary | ICD-10-CM | POA: Diagnosis not present

## 2018-04-18 DIAGNOSIS — I10 Essential (primary) hypertension: Secondary | ICD-10-CM | POA: Diagnosis not present

## 2018-04-18 DIAGNOSIS — R5383 Other fatigue: Secondary | ICD-10-CM | POA: Diagnosis not present

## 2018-04-18 DIAGNOSIS — Z0000 Encounter for general adult medical examination without abnormal findings: Secondary | ICD-10-CM | POA: Diagnosis not present

## 2018-05-05 DIAGNOSIS — H25813 Combined forms of age-related cataract, bilateral: Secondary | ICD-10-CM | POA: Diagnosis not present

## 2018-05-05 DIAGNOSIS — H401133 Primary open-angle glaucoma, bilateral, severe stage: Secondary | ICD-10-CM | POA: Diagnosis not present

## 2018-05-05 DIAGNOSIS — H538 Other visual disturbances: Secondary | ICD-10-CM | POA: Diagnosis not present

## 2018-06-25 DIAGNOSIS — E039 Hypothyroidism, unspecified: Secondary | ICD-10-CM | POA: Diagnosis not present

## 2018-06-25 DIAGNOSIS — E063 Autoimmune thyroiditis: Secondary | ICD-10-CM | POA: Diagnosis not present

## 2018-06-25 DIAGNOSIS — R5383 Other fatigue: Secondary | ICD-10-CM | POA: Diagnosis not present

## 2018-06-25 DIAGNOSIS — E663 Overweight: Secondary | ICD-10-CM | POA: Diagnosis not present

## 2018-06-25 DIAGNOSIS — J302 Other seasonal allergic rhinitis: Secondary | ICD-10-CM | POA: Diagnosis not present

## 2018-06-25 DIAGNOSIS — J0100 Acute maxillary sinusitis, unspecified: Secondary | ICD-10-CM | POA: Diagnosis not present

## 2018-06-25 DIAGNOSIS — Z6826 Body mass index (BMI) 26.0-26.9, adult: Secondary | ICD-10-CM | POA: Diagnosis not present

## 2018-10-03 DIAGNOSIS — Z6827 Body mass index (BMI) 27.0-27.9, adult: Secondary | ICD-10-CM | POA: Diagnosis not present

## 2018-10-03 DIAGNOSIS — Z1389 Encounter for screening for other disorder: Secondary | ICD-10-CM | POA: Diagnosis not present

## 2018-10-03 DIAGNOSIS — R69 Illness, unspecified: Secondary | ICD-10-CM | POA: Diagnosis not present

## 2018-10-03 DIAGNOSIS — N529 Male erectile dysfunction, unspecified: Secondary | ICD-10-CM | POA: Diagnosis not present

## 2018-10-03 DIAGNOSIS — E663 Overweight: Secondary | ICD-10-CM | POA: Diagnosis not present

## 2018-10-08 DIAGNOSIS — R69 Illness, unspecified: Secondary | ICD-10-CM | POA: Diagnosis not present

## 2018-11-10 DIAGNOSIS — E063 Autoimmune thyroiditis: Secondary | ICD-10-CM | POA: Diagnosis not present

## 2018-11-10 DIAGNOSIS — M1991 Primary osteoarthritis, unspecified site: Secondary | ICD-10-CM | POA: Diagnosis not present

## 2018-11-10 DIAGNOSIS — E039 Hypothyroidism, unspecified: Secondary | ICD-10-CM | POA: Diagnosis not present

## 2018-11-10 DIAGNOSIS — I10 Essential (primary) hypertension: Secondary | ICD-10-CM | POA: Diagnosis not present

## 2018-11-10 DIAGNOSIS — H409 Unspecified glaucoma: Secondary | ICD-10-CM | POA: Diagnosis not present

## 2018-12-22 DIAGNOSIS — E663 Overweight: Secondary | ICD-10-CM | POA: Diagnosis not present

## 2018-12-22 DIAGNOSIS — M79669 Pain in unspecified lower leg: Secondary | ICD-10-CM | POA: Diagnosis not present

## 2018-12-22 DIAGNOSIS — Z23 Encounter for immunization: Secondary | ICD-10-CM | POA: Diagnosis not present

## 2018-12-22 DIAGNOSIS — Z6827 Body mass index (BMI) 27.0-27.9, adult: Secondary | ICD-10-CM | POA: Diagnosis not present

## 2018-12-22 DIAGNOSIS — G4730 Sleep apnea, unspecified: Secondary | ICD-10-CM | POA: Diagnosis not present

## 2018-12-31 DIAGNOSIS — G4733 Obstructive sleep apnea (adult) (pediatric): Secondary | ICD-10-CM | POA: Diagnosis not present

## 2019-01-28 IMAGING — DX DG ANKLE COMPLETE 3+V*R*
3 series · 3 of 3 positions shown · non-contrast
Comparison: None.

CLINICAL DATA: 72-year-old with right ankle pain after slip and
fall.

EXAM:
RIGHT ANKLE - COMPLETE 3+ VIEW

[ankle ap]
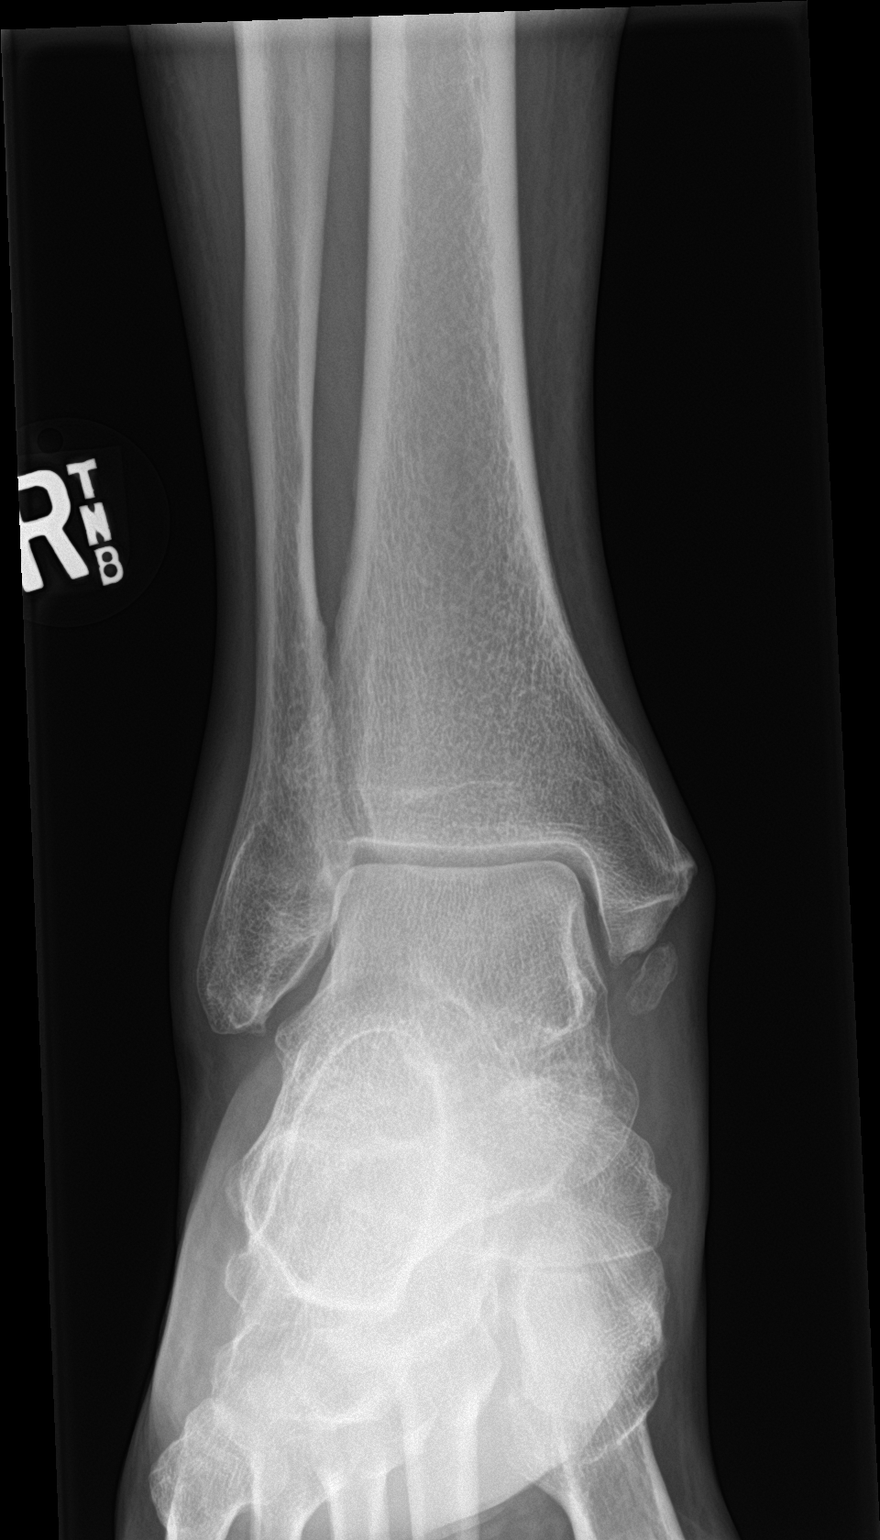

[ankle obl]
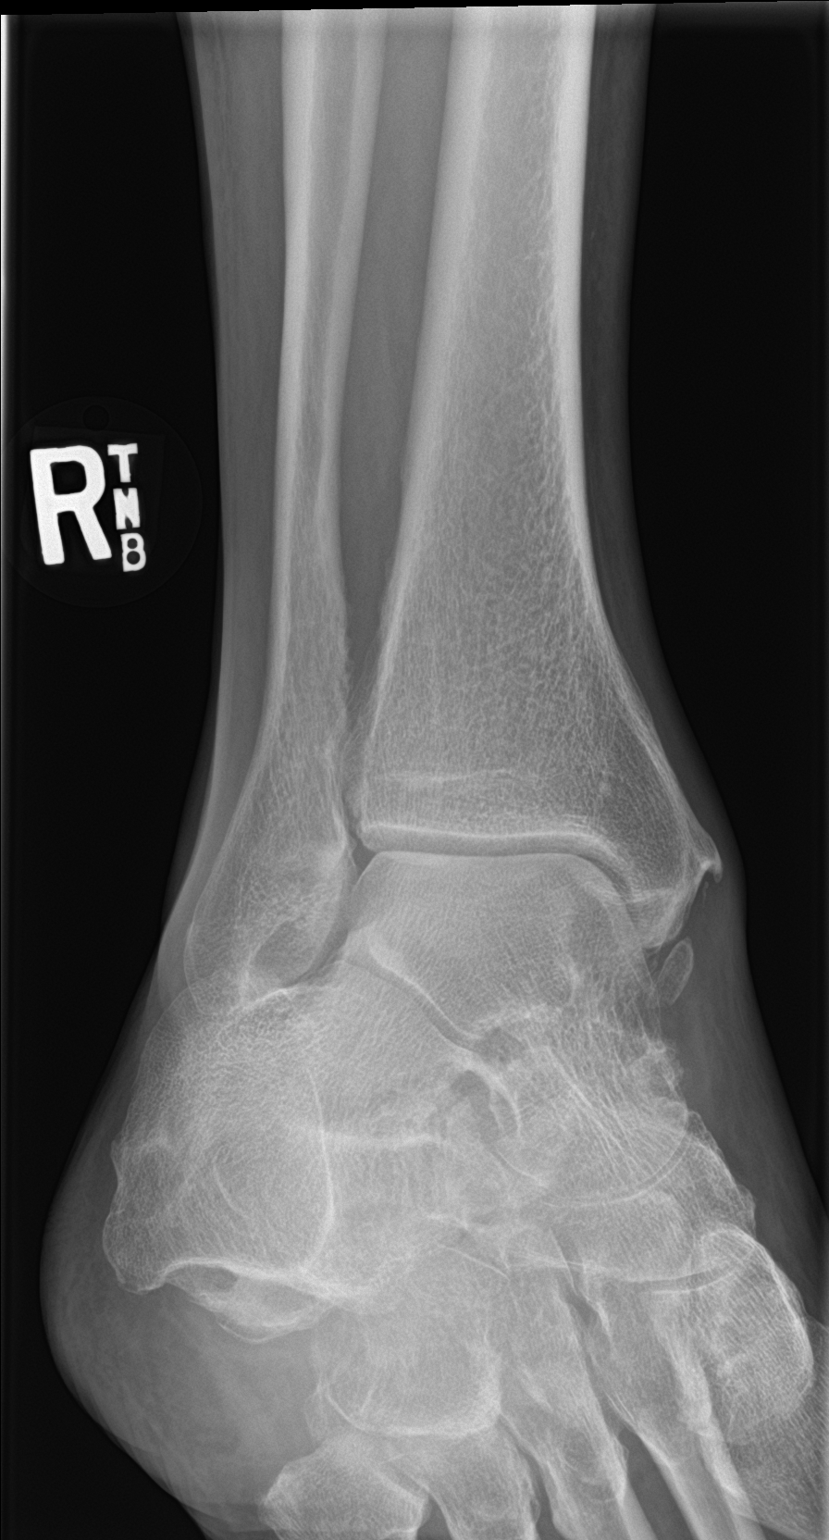

[ankle lat]
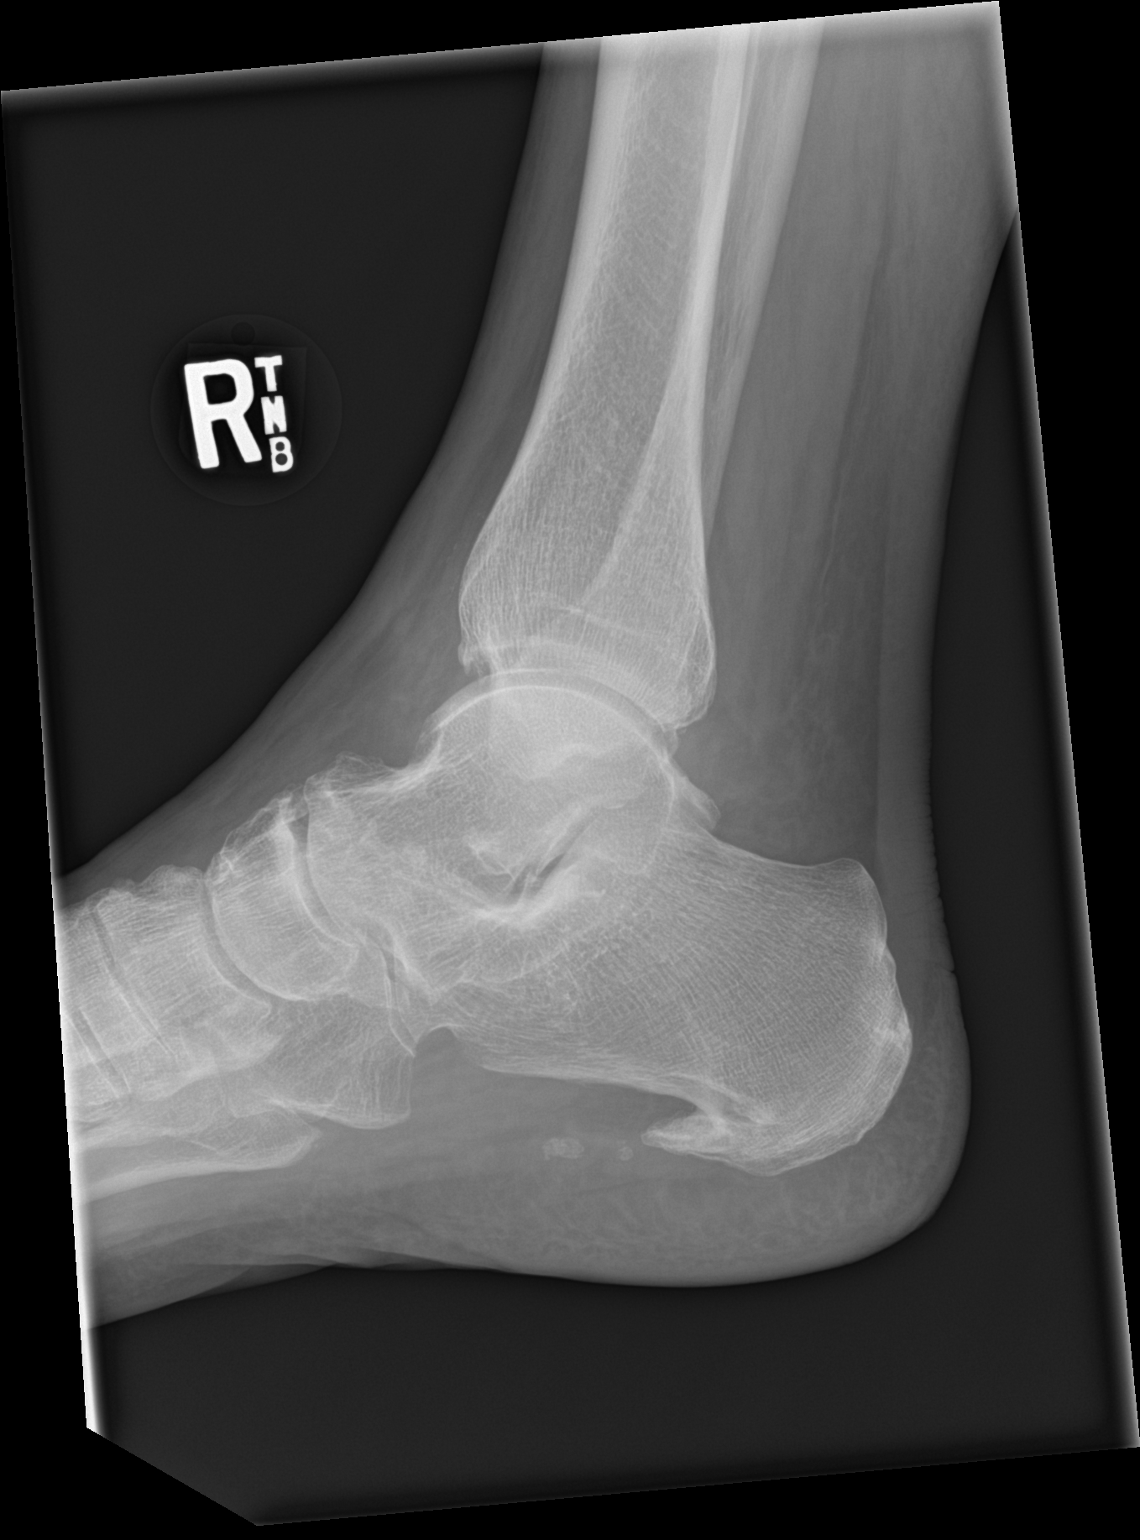

[3 of 3 positions shown; findings below may reference images not displayed]

FINDINGS: Negative for an acute fracture or dislocation. Large loose bodies
along the medial malleolus appear chronic. Large plantar calcaneal
spur. Ossifications along the plantar fascia. Degenerative changes
at the talonavicular joint. Mild lateral soft tissue swelling.
IMPRESSION: No acute bone abnormality in the right ankle.

## 2019-01-29 DIAGNOSIS — R1030 Lower abdominal pain, unspecified: Secondary | ICD-10-CM | POA: Diagnosis not present

## 2019-01-29 DIAGNOSIS — Z0000 Encounter for general adult medical examination without abnormal findings: Secondary | ICD-10-CM | POA: Diagnosis not present

## 2019-01-29 DIAGNOSIS — E782 Mixed hyperlipidemia: Secondary | ICD-10-CM | POA: Diagnosis not present

## 2019-01-29 DIAGNOSIS — Z6826 Body mass index (BMI) 26.0-26.9, adult: Secondary | ICD-10-CM | POA: Diagnosis not present

## 2019-01-29 DIAGNOSIS — Z0001 Encounter for general adult medical examination with abnormal findings: Secondary | ICD-10-CM | POA: Diagnosis not present

## 2019-01-29 DIAGNOSIS — E559 Vitamin D deficiency, unspecified: Secondary | ICD-10-CM | POA: Diagnosis not present

## 2019-01-29 DIAGNOSIS — Z1389 Encounter for screening for other disorder: Secondary | ICD-10-CM | POA: Diagnosis not present

## 2019-01-29 DIAGNOSIS — R8290 Unspecified abnormal findings in urine: Secondary | ICD-10-CM | POA: Diagnosis not present

## 2019-02-19 DIAGNOSIS — H401133 Primary open-angle glaucoma, bilateral, severe stage: Secondary | ICD-10-CM | POA: Diagnosis not present

## 2019-02-19 DIAGNOSIS — H25813 Combined forms of age-related cataract, bilateral: Secondary | ICD-10-CM | POA: Diagnosis not present

## 2019-02-19 DIAGNOSIS — H0102A Squamous blepharitis right eye, upper and lower eyelids: Secondary | ICD-10-CM | POA: Diagnosis not present

## 2019-02-19 DIAGNOSIS — H0102B Squamous blepharitis left eye, upper and lower eyelids: Secondary | ICD-10-CM | POA: Diagnosis not present

## 2019-02-25 NOTE — Progress Notes (Deleted)
CARDIOLOGY CONSULT NOTE       Patient ID: John Melendez MRN: [id number] DOB/AGE: 10-21-1945 73 y.o.  Admit date: (Not on file) Referring Physician: Gerarda Fraction Primary Physician: Redmond School, MD Primary Cardiologist: New Reason for Consultation: Dyspnea  Active Problems:   * No active hospital problems. *   HPI:  73 y.o. referred by Dr Gerarda Fraction for dyspnea. Quit smoking in 1991. History of OSA, Anxiety/Depression, GERD, HTN And hypothyroidism He has no issues with previous cardiac conditions. No CHF, Arrhythmia or CAD. Dr Gerarda Fraction has not done CXR PFTls or other w/u for his dyspnea   *** ROS All other systems reviewed and negative except as noted above  Past Medical History:  Diagnosis Date  . Anxiety   . Arthritis   . Depression   . GERD (gastroesophageal reflux disease)    no PPI currently, symptoms controlled with diet and behavior  . Glaucoma   . History of kidney stones   . Hypertension   . Hypothyroidism   . Sleep apnea     Family History  Problem Relation Age of Onset  . Colon cancer Neg Hx     Social History   Socioeconomic History  . Marital status: Divorced    Spouse name: Not on file  . Number of children: 7  . Years of education: Not on file  . Highest education level: Not on file  Occupational History  . Not on file  Tobacco Use  . Smoking status: Former Smoker    Packs/day: 3.00    Years: 34.00    Pack years: 102.00    Quit date: 06/06/1989    Years since quitting: 29.7  . Smokeless tobacco: Never Used  Substance and Sexual Activity  . Alcohol use: No  . Drug use: No  . Sexual activity: Yes    Birth control/protection: None  Other Topics Concern  . Not on file  Social History Narrative  . Not on file   Social Determinants of Health   Financial Resource Strain:   . Difficulty of Paying Living Expenses: Not on file  Food Insecurity:   . Worried About Charity fundraiser in the Last Year: Not on file  . Ran Out of Food in the Last Year:  Not on file  Transportation Needs:   . Lack of Transportation (Medical): Not on file  . Lack of Transportation (Non-Medical): Not on file  Physical Activity:   . Days of Exercise per Week: Not on file  . Minutes of Exercise per Session: Not on file  Stress:   . Feeling of Stress : Not on file  Social Connections:   . Frequency of Communication with Friends and Family: Not on file  . Frequency of Social Gatherings with Friends and Family: Not on file  . Attends Religious Services: Not on file  . Active Member of Clubs or Organizations: Not on file  . Attends Archivist Meetings: Not on file  . Marital Status: Not on file  Intimate Partner Violence:   . Fear of Current or Ex-Partner: Not on file  . Emotionally Abused: Not on file  . Physically Abused: Not on file  . Sexually Abused: Not on file    Past Surgical History:  Procedure Laterality Date  . BACK SURGERY     ruptured lumbar disc.  Marland Kitchen CHOLECYSTECTOMY    . COLONOSCOPY  02/15/14   [id number] papilla internal hemorrhoids/colonic diverticulosis/multiple colonic polyp  . ESOPHAGOGASTRODUODENOSCOPY  2004   Dr. Gala Romney: non-critical Schatzki's  ring s/p dilation with 31 F, small hiatal hernia, normal D1 and D2   . ESOPHAGOGASTRODUODENOSCOPY  02/15/14   [id number] schatzki's ring not manipulated/hiatal hernia  . INCISIONAL HERNIA REPAIR N/A 06/10/2017   Procedure: HERNIA REPAIR INCISIONAL WITH MESH;  Surgeon: Aviva Signs, MD;  Location: [facility];  Service: General;  Laterality: N/A;  . SLT LASER APPLICATION Right [id number]   Procedure: SLT LASER APPLICATION;  Surgeon: Williams Che, MD;  Location: [facility];  Service: Ophthalmology;  Laterality: Right;        Physical Exam: There were no vitals taken for this visit.    Affect appropriate Healthy:  appears stated age 73: normal Neck supple with no adenopathy JVP normal no bruits no thyromegaly Lungs clear with no wheezing and good diaphragmatic motion Heart:   S1/S2 no murmur, no rub, gallop or click PMI normal Abdomen: benighn, BS positve, no tenderness, no AAA no bruit.  No HSM or HJR Distal pulses intact with no bruits No edema Neuro non-focal Skin warm and dry No muscular weakness   Labs:   Lab Results  Component Value Date   WBC 7.7 06/06/2017   HGB 14.5 06/06/2017   HCT 43.0 06/06/2017   MCV 94.1 06/06/2017   PLT 186 06/06/2017      Radiology: No results found.  EKG: SR rate 65 PVC 06/06/17     ASSESSMENT AND PLAN:   1. Dyspnea:  *** 2. HTN:  Well controlled.  Continue current medications and low sodium Dash type diet.   3. Hypothyroidism:  Continue synthroid replacement labs with primary  4. Anxiety/Depresssion: has zanax f/u with primary   Signed: Jenkins Rouge 02/25/2019, 2:53 PM

## 2019-03-03 ENCOUNTER — Ambulatory Visit: Payer: Medicare HMO | Admitting: Cardiovascular Disease

## 2019-03-16 NOTE — Progress Notes (Deleted)
CARDIOLOGY CONSULT NOTE       Patient ID: John Melendez MRN: [id number] DOB/AGE: 1945-04-09 74 y.o.  Admit date: (Not on file) Referring Physician: Collene Mares PA C Primary Physician: Redmond School, MD Primary Cardiologist: New Reason for Consultation: Dyspnea  Active Problems:   * No active hospital problems. *   HPI:  74 y.o. referred by Collene Mares PA C for dyspnea. Gradual onset over lst 6 months Worse with exertion Better with rest No cough whezzing fevers palpitations. No recent COVID contacts Chronic abdominal pain for year has seen Dr Gala Romney and Glennon Hamilton of GI He is former smoker. Retired and sedentary Haematologist for BP   ROS All other systems reviewed and negative except as noted above  Past Medical History:  Diagnosis Date  . Anxiety   . Arthritis   . Depression   . GERD (gastroesophageal reflux disease)    no PPI currently, symptoms controlled with diet and behavior  . Glaucoma   . History of kidney stones   . Hypertension   . Hypothyroidism   . Sleep apnea     Family History  Problem Relation Age of Onset  . Colon cancer Neg Hx     Social History   Socioeconomic History  . Marital status: Divorced    Spouse name: Not on file  . Number of children: 7  . Years of education: Not on file  . Highest education level: Not on file  Occupational History  . Not on file  Tobacco Use  . Smoking status: Former Smoker    Packs/day: 3.00    Years: 34.00    Pack years: 102.00    Quit date: 06/06/1989    Years since quitting: 29.7  . Smokeless tobacco: Never Used  Substance and Sexual Activity  . Alcohol use: No  . Drug use: No  . Sexual activity: Yes    Birth control/protection: None  Other Topics Concern  . Not on file  Social History Narrative  . Not on file   Social Determinants of Health   Financial Resource Strain:   . Difficulty of Paying Living Expenses: Not on file  Food Insecurity:   . Worried About Charity fundraiser in the Last  Year: Not on file  . Ran Out of Food in the Last Year: Not on file  Transportation Needs:   . Lack of Transportation (Medical): Not on file  . Lack of Transportation (Non-Medical): Not on file  Physical Activity:   . Days of Exercise per Week: Not on file  . Minutes of Exercise per Session: Not on file  Stress:   . Feeling of Stress : Not on file  Social Connections:   . Frequency of Communication with Friends and Family: Not on file  . Frequency of Social Gatherings with Friends and Family: Not on file  . Attends Religious Services: Not on file  . Active Member of Clubs or Organizations: Not on file  . Attends Archivist Meetings: Not on file  . Marital Status: Not on file  Intimate Partner Violence:   . Fear of Current or Ex-Partner: Not on file  . Emotionally Abused: Not on file  . Physically Abused: Not on file  . Sexually Abused: Not on file    Past Surgical History:  Procedure Laterality Date  . BACK SURGERY     ruptured lumbar disc.  Marland Kitchen CHOLECYSTECTOMY    . COLONOSCOPY  02/15/14   [id number] papilla internal hemorrhoids/colonic diverticulosis/multiple colonic polyp  .  ESOPHAGOGASTRODUODENOSCOPY  2004   Dr. Gala Romney: non-critical Schatzki's ring s/p dilation with 49 F, small hiatal hernia, normal D1 and D2   . ESOPHAGOGASTRODUODENOSCOPY  02/15/14   [id number] schatzki's ring not manipulated/hiatal hernia  . INCISIONAL HERNIA REPAIR N/A 06/10/2017   Procedure: HERNIA REPAIR INCISIONAL WITH MESH;  Surgeon: Aviva Signs, MD;  Location: [facility];  Service: General;  Laterality: N/A;  . SLT LASER APPLICATION Right 3/0/9407   Procedure: SLT LASER APPLICATION;  Surgeon: Williams Che, MD;  Location: [facility];  Service: Ophthalmology;  Laterality: Right;        Physical Exam: There were no vitals taken for this visit.   Affect appropriate Healthy:  appears stated age 30: normal Neck supple with no adenopathy JVP normal no bruits no thyromegaly Lungs clear with  no wheezing and good diaphragmatic motion Heart:  S1/S2 no murmur, no rub, gallop or click PMI normal Abdomen: benighn, BS positve, no tenderness, no AAA no bruit.  No HSM or HJR Distal pulses intact with no bruits No edema Neuro non-focal Skin warm and dry No muscular weakness   Labs:   Lab Results  Component Value Date   WBC 7.7 06/06/2017   HGB 14.5 06/06/2017   HCT 43.0 06/06/2017   MCV 94.1 06/06/2017   PLT 186 06/06/2017      Radiology: No results found.  EKG: SR rate 52 normal    ASSESSMENT AND PLAN:   1. Dyspnea:  *** 2. Abdominal Pain: chronic negative w/u f/u GI 3. HTN:  Well controlled.  Continue current medications and low sodium Dash type diet.  Continue Zestril    Signed: Jenkins Rouge 03/16/2019, 5:30 PM

## 2019-03-20 ENCOUNTER — Ambulatory Visit: Payer: Medicare Other | Admitting: Cardiovascular Disease

## 2019-04-13 ENCOUNTER — Encounter: Payer: Self-pay | Admitting: Cardiology

## 2019-04-13 ENCOUNTER — Ambulatory Visit: Payer: Medicare Other | Admitting: Cardiology

## 2019-04-13 VITALS — BP 138/80 | HR 68 | Temp 99.0°F | Ht 73.0 in | Wt 208.0 lb

## 2019-04-13 DIAGNOSIS — R0600 Dyspnea, unspecified: Secondary | ICD-10-CM

## 2019-04-13 DIAGNOSIS — R0609 Other forms of dyspnea: Secondary | ICD-10-CM

## 2019-04-13 DIAGNOSIS — R0602 Shortness of breath: Secondary | ICD-10-CM

## 2019-04-13 NOTE — Patient Instructions (Signed)
Medication Instructions:  Your physician recommends that you continue on your current medications as directed. Please refer to the Current Medication list given to you today.   Labwork: NONE  Testing/Procedures: Your physician has requested that you have an echocardiogram. Echocardiography is a painless test that uses sound waves to create images of your heart. It provides your doctor with information about the size and shape of your heart and how well your heart's chambers and valves are working. This procedure takes approximately one hour. There are no restrictions for this procedure.    Follow-Up: Your physician recommends that you schedule a follow-up appointment in: TO BE DETERMINED BASED ON TEST RESULTS    Any Other Special Instructions Will Be Listed Below (If Applicable).     If you need a refill on your cardiac medications before your next appointment, please call your pharmacy.

## 2019-04-13 NOTE — Progress Notes (Signed)
Clinical Summary John Melendez is a 74 y.o.male seen as new consult, referred by PA Mann for SOB.   1. SOB/DOE - started about 1 year - DOE with yardwork, or walking uphill - no recent edema - some SOB with laying flat - no coughing or wheezing. Former smoker, stopped 25 years ago. Former x 34 years - no chest pain.    CAD risk factors: HTN, HL, prior tobacco history  Past Medical History:  Diagnosis Date  . Anxiety   . Arthritis   . Depression   . GERD (gastroesophageal reflux disease)    no PPI currently, symptoms controlled with diet and behavior  . Glaucoma   . History of kidney stones   . Hypertension   . Hypothyroidism   . Sleep apnea      Allergies  Allergen Reactions  . Penicillins Nausea Only     Current Outpatient Medications  Medication Sig Dispense Refill  . ALPRAZolam (XANAX) 0.5 MG tablet Take 0.5 mg by mouth 3 (three) times daily as needed.    . brimonidine (ALPHAGAN) 0.2 % ophthalmic solution 2 (two) times daily.    . calcium carbonate (TUMS EX) 750 MG chewable tablet Chew 1 tablet by mouth daily.    . cyclobenzaprine (FLEXERIL) 10 MG tablet Take 1 tablet (10 mg total) by mouth at bedtime and may repeat dose one time if needed. 20 tablet 0  . cyclobenzaprine (FLEXERIL) 10 MG tablet Take 1 tablet (10 mg total) by mouth at bedtime. 30 tablet 1  . FLUoxetine (PROZAC) 20 MG tablet Take 20 mg by mouth daily.    Marland Kitchen HYDROcodone-acetaminophen (NORCO) 5-325 MG tablet Take 1 tablet by mouth every 6 (six) hours as needed for moderate pain. 25 tablet 0  . latanoprost (XALATAN) 0.005 % ophthalmic solution 1 drop at bedtime.    . Levothyroxine Sodium 88 MCG CAPS Take 88 mcg by mouth daily before breakfast.    . lisinopril (PRINIVIL,ZESTRIL) 10 MG tablet Take 10 mg by mouth daily.    Marland Kitchen loratadine (CLARITIN) 10 MG tablet Take 10 mg by mouth daily.    . Multiple Vitamins-Minerals (ALIVE ENERGY 50+ PO) Take 1 tablet by mouth daily.    . pantoprazole (PROTONIX) 40 MG  tablet TAKE 1 TABLET ONCE A DAY 30 MINUTES BEFORE BREAKFAST. 30 tablet 11  . polyethylene glycol-electrolytes (NULYTELY/GOLYTELY) 420 G solution Take 4,000 mLs by mouth once. (Patient not taking: Reported on 05/14/2017) 4000 mL 0  . timolol (BETIMOL) 0.5 % ophthalmic solution 1 drop 2 (two) times daily.     No current facility-administered medications for this visit.     Past Surgical History:  Procedure Laterality Date  . BACK SURGERY     ruptured lumbar disc.  Marland Kitchen CHOLECYSTECTOMY    . COLONOSCOPY  02/15/14   [id number] papilla internal hemorrhoids/colonic diverticulosis/multiple colonic polyp  . ESOPHAGOGASTRODUODENOSCOPY  2004   Dr. Gala Romney: non-critical Schatzki's ring s/p dilation with 54 F, small hiatal hernia, normal D1 and D2   . ESOPHAGOGASTRODUODENOSCOPY  02/15/14   [id number] schatzki's ring not manipulated/hiatal hernia  . INCISIONAL HERNIA REPAIR N/A 06/10/2017   Procedure: HERNIA REPAIR INCISIONAL WITH MESH;  Surgeon: Aviva Signs, MD;  Location: [facility];  Service: General;  Laterality: N/A;  . SLT LASER APPLICATION Right [id number]   Procedure: SLT LASER APPLICATION;  Surgeon: Williams Che, MD;  Location: [facility];  Service: Ophthalmology;  Laterality: Right;     Allergies  Allergen Reactions  . Penicillins Nausea Only  Family History  Problem Relation Age of Onset  . Colon cancer Neg Hx      Social History John Melendez reports that he quit smoking about 29 years ago. He has a 102.00 pack-year smoking history. He has never used smokeless tobacco. John Melendez reports no history of alcohol use.   Review of Systems CONSTITUTIONAL: No weight loss, fever, chills, weakness or fatigue.  HEENT: Eyes: No visual loss, blurred vision, double vision or yellow sclerae.No hearing loss, sneezing, congestion, runny nose or sore throat.  SKIN: No rash or itching.  CARDIOVASCULAR: per hpi RESPIRATORY: No shortness of breath, cough or sputum.  GASTROINTESTINAL: No anorexia,  nausea, vomiting or diarrhea. No abdominal pain or blood.  GENITOURINARY: No burning on urination, no polyuria NEUROLOGICAL: No headache, dizziness, syncope, paralysis, ataxia, numbness or tingling in the extremities. No change in bowel or bladder control.  MUSCULOSKELETAL: No muscle, back pain, joint pain or stiffness.  LYMPHATICS: No enlarged nodes. No history of splenectomy.  PSYCHIATRIC: No history of depression or anxiety.  ENDOCRINOLOGIC: No reports of sweating, cold or heat intolerance. No polyuria or polydipsia.  Marland Kitchen   Physical Examination Today's Vitals   04/13/19 1456 04/13/19 1508  BP: (!) 142/81 138/80  Pulse: 68   Temp: 99 F (37.2 C)   SpO2: 95%   Weight: 208 lb (94.3 kg)   Height: 6\' 1"  (1.854 m)    Body mass index is 27.44 kg/m.  Gen: resting comfortably, no acute distress HEENT: no scleral icterus, pupils equal round and reactive, no palptable cervical adenopathy,  CV: RRR, no m/r/g no jvd Resp: Clear to auscultation bilaterally GI: abdomen is soft, non-tender, non-distended, normal bowel sounds, no hepatosplenomegaly MSK: extremities are warm, no edema.  Skin: warm, no rash Neuro:  no focal deficits Psych: appropriate affect    Assessment and Plan  1. DOE - unclear etiology - multiple cardiac risk factors - we will obtain an echo to evaluate cardiac function - pending results would plan for a lexiscan to evaluate for underlying CAD   F/u pending results      Arnoldo Lenis, M.D

## 2019-04-16 ENCOUNTER — Other Ambulatory Visit: Payer: Self-pay

## 2019-04-16 ENCOUNTER — Ambulatory Visit (HOSPITAL_COMMUNITY)
Admission: RE | Admit: 2019-04-16 | Discharge: 2019-04-16 | Disposition: A | Discharge status: Home / Self Care | Payer: Medicare Other | Source: Ambulatory Visit | Attending: Cardiology | Admitting: Cardiology

## 2019-04-16 DIAGNOSIS — R0602 Shortness of breath: Secondary | ICD-10-CM | POA: Diagnosis not present

## 2019-04-16 NOTE — Progress Notes (Signed)
*  PRELIMINARY RESULTS* Echocardiogram 2D Echocardiogram has been performed.  Samuel Germany 04/16/2019, 10:16 AM

## 2019-04-22 ENCOUNTER — Telehealth: Payer: Self-pay

## 2019-04-22 DIAGNOSIS — R0609 Other forms of dyspnea: Secondary | ICD-10-CM

## 2019-04-22 DIAGNOSIS — R079 Chest pain, unspecified: Secondary | ICD-10-CM

## 2019-04-22 DIAGNOSIS — R0600 Dyspnea, unspecified: Secondary | ICD-10-CM

## 2019-04-22 NOTE — Telephone Encounter (Signed)
-----   Message from Arnoldo Lenis, MD sent at 04/20/2019  8:02 AM EST ----- Echo looks good, normal heart function. Would recommend a lexiscan to evaluate for any underlying heart artery blockages that could be causing his symptoms   Zandra Abts MD

## 2019-04-22 NOTE — Telephone Encounter (Signed)
Pt made aware, he voiced understanding of results & plan for stress test. Order placed.

## 2019-05-01 ENCOUNTER — Telehealth (HOSPITAL_COMMUNITY): Payer: Self-pay | Admitting: *Deleted

## 2019-05-01 NOTE — Telephone Encounter (Signed)
Close encounter 

## 2019-05-05 ENCOUNTER — Other Ambulatory Visit: Payer: Self-pay

## 2019-05-05 ENCOUNTER — Ambulatory Visit (HOSPITAL_COMMUNITY)
Admission: RE | Admit: 2019-05-05 | Discharge: 2019-05-05 | Disposition: A | Discharge status: Home / Self Care | Payer: Medicare Other | Source: Ambulatory Visit | Attending: Cardiology | Admitting: Cardiology

## 2019-05-05 DIAGNOSIS — R079 Chest pain, unspecified: Secondary | ICD-10-CM | POA: Diagnosis not present

## 2019-05-05 DIAGNOSIS — R0600 Dyspnea, unspecified: Secondary | ICD-10-CM | POA: Diagnosis not present

## 2019-05-05 LAB — MYOCARDIAL PERFUSION IMAGING
LV dias vol: 122 mL (ref 62–150)
LV sys vol: 62 mL
Peak HR: 97 {beats}/min
Rest HR: 54 {beats}/min
SDS: 1
SRS: 1
SSS: 2
TID: 1.09

## 2019-05-05 MED ORDER — TECHNETIUM TC 99M TETROFOSMIN IV KIT
9.9000 | PACK | Freq: Once | INTRAVENOUS | Status: AC | PRN
  Administered 2019-05-05: 9.9 via INTRAVENOUS
  Filled 2019-05-05: qty 10

## 2019-05-05 MED ORDER — REGADENOSON 0.4 MG/5ML IV SOLN
0.4000 mg | Freq: Once | INTRAVENOUS | Status: AC
  Administered 2019-05-05: 0.4 mg via INTRAVENOUS

## 2019-05-05 MED ORDER — TECHNETIUM TC 99M TETROFOSMIN IV KIT
31.0000 | PACK | Freq: Once | INTRAVENOUS | Status: AC | PRN
  Administered 2019-05-05: 31 via INTRAVENOUS
  Filled 2019-05-05: qty 31

## 2019-05-06 ENCOUNTER — Telehealth: Payer: Self-pay

## 2019-05-06 NOTE — Telephone Encounter (Signed)
Report not resulted, pt made aware

## 2019-05-06 NOTE — Telephone Encounter (Signed)
Pt had Stress test 05-05-19 and is asking if results are ready.  Please call [phone]  Thanks renee

## 2019-05-11 ENCOUNTER — Telehealth: Payer: Self-pay

## 2019-05-11 NOTE — Telephone Encounter (Signed)
Pt made aware. Voiced understanding. Fu made.

## 2019-05-11 NOTE — Telephone Encounter (Signed)
Pt made aware, voiced understanding. He will discuss other reasons for symptoms with pcp.

## 2019-05-11 NOTE — Telephone Encounter (Signed)
-----   Message from Arnoldo Lenis, MD sent at 05/08/2019  1:11 PM EST ----- Normal stress test. From all the heart tests things have looked good, nothing to explain his symptoms. Needs to f/u with pcp to consider non heart causes, f/u with [place] 4 months  Zandra Abts MD

## 2019-05-11 NOTE — Telephone Encounter (Signed)
Calling for results.

## 2019-05-19 DIAGNOSIS — H6123 Impacted cerumen, bilateral: Secondary | ICD-10-CM | POA: Diagnosis not present

## 2019-05-19 DIAGNOSIS — M25579 Pain in unspecified ankle and joints of unspecified foot: Secondary | ICD-10-CM | POA: Diagnosis not present

## 2019-05-19 DIAGNOSIS — R0602 Shortness of breath: Secondary | ICD-10-CM | POA: Diagnosis not present

## 2019-07-24 DIAGNOSIS — G4733 Obstructive sleep apnea (adult) (pediatric): Secondary | ICD-10-CM | POA: Diagnosis not present

## 2019-08-13 ENCOUNTER — Other Ambulatory Visit: Payer: Self-pay

## 2019-08-13 ENCOUNTER — Ambulatory Visit
Admission: EM | Admit: 2019-08-13 | Discharge: 2019-08-13 | Disposition: A | Discharge status: Home / Self Care | Payer: Medicare Other | Attending: Emergency Medicine | Admitting: Emergency Medicine

## 2019-08-13 DIAGNOSIS — J069 Acute upper respiratory infection, unspecified: Secondary | ICD-10-CM

## 2019-08-13 DIAGNOSIS — J019 Acute sinusitis, unspecified: Secondary | ICD-10-CM | POA: Diagnosis not present

## 2019-08-13 MED ORDER — DOXYCYCLINE HYCLATE 100 MG PO CAPS: 100.0000 mg | ORAL_CAPSULE | Freq: Two times a day (BID) | ORAL | 0 refills | Status: DC

## 2019-08-13 NOTE — Discharge Instructions (Signed)
COVID testing ordered.  It will take between 2-5 days for test results.  Someone will contact you regarding abnormal results.    In the meantime: You should remain isolated in your home for 10 days from symptom onset AND greater than 72 hours after symptoms resolution (absence of fever without the use of fever-reducing medication and improvement in respiratory symptoms), whichever is longer Get plenty of rest and push fluids Doxycycline prescribed for sinus infection.  Take as directed and to completion Use OTC antihistamine for nasal congestion, runny nose, and/or sore throat Use OTC flonase for nasal congestion and runny nose Use medications daily for symptom relief Use OTC medications like ibuprofen or tylenol as needed fever or pain Call or go to the ED if you have any new or worsening symptoms such as fever, cough, shortness of breath, chest tightness, chest pain, turning blue, changes in mental status, etc..Marland Kitchen

## 2019-08-13 NOTE — ED Triage Notes (Signed)
Pt presents with c/o nasal congestion for past month

## 2019-08-13 NOTE — ED Provider Notes (Signed)
[facility]   [id number] 08/13/19 Arrival Time: 1805   CC: COVID symptoms  SUBJECTIVE: History from: patient.  John Melendez is a 74 y.o. male who presents with fatigue, nasal congestion, drainage, and mild sore throat x 1 month.  Daughter with similar symptoms, her COVID test pending.  Has tried OTC medications without relief.  Symptoms are made worse at night.  Reports previous symptoms in the past with infection.   Denies fever, chills, SOB, wheezing, chest pain, nausea, changes in bowel or bladder habits.    ROS: As per HPI.  All other pertinent ROS negative.     Past Medical History:  Diagnosis Date   Anxiety    Arthritis    Depression    GERD (gastroesophageal reflux disease)    no PPI currently, symptoms controlled with diet and behavior   Glaucoma    History of kidney stones    Hypertension    Hypothyroidism    Sleep apnea    Past Surgical History:  Procedure Laterality Date   BACK SURGERY     ruptured lumbar disc.   CHOLECYSTECTOMY     COLONOSCOPY  02/15/14   [id number] papilla internal hemorrhoids/colonic diverticulosis/multiple colonic polyp   ESOPHAGOGASTRODUODENOSCOPY  2004   Dr. Gala Romney: non-critical Schatzki's ring s/p dilation with 87 F, small hiatal hernia, normal D1 and D2    ESOPHAGOGASTRODUODENOSCOPY  02/15/14   [id number] schatzki's ring not manipulated/hiatal hernia   INCISIONAL HERNIA REPAIR N/A 06/10/2017   Procedure: HERNIA REPAIR INCISIONAL WITH MESH;  Surgeon: Aviva Signs, MD;  Location: [facility];  Service: General;  Laterality: N/A;   SLT LASER APPLICATION Right [id number]   Procedure: SLT LASER APPLICATION;  Surgeon: Williams Che, MD;  Location: [facility];  Service: Ophthalmology;  Laterality: Right;   Allergies  Allergen Reactions   Penicillins Nausea Only   No current facility-administered medications on file prior to encounter.   Current Outpatient Medications on File Prior to Encounter  Medication Sig Dispense  Refill   ALPRAZolam (XANAX) 0.5 MG tablet Take 0.5 mg by mouth 3 (three) times daily as needed.     brimonidine (ALPHAGAN) 0.2 % ophthalmic solution 2 (two) times daily.     calcium carbonate (TUMS EX) 750 MG chewable tablet Chew 1 tablet by mouth daily.     Cholecalciferol (VITAMIN D3) 50 MCG (2000 UT) capsule Take 2,000 Units by mouth daily.     citalopram (CELEXA) 40 MG tablet Take 40 mg by mouth daily.     gabapentin (NEURONTIN) 300 MG capsule Take 300 mg by mouth at bedtime.     latanoprost (XALATAN) 0.005 % ophthalmic solution 1 drop at bedtime.     Levothyroxine Sodium 88 MCG CAPS Take 88 mcg by mouth daily before breakfast.     lisinopril (PRINIVIL,ZESTRIL) 10 MG tablet Take 10 mg by mouth daily.     loratadine (CLARITIN) 10 MG tablet Take 10 mg by mouth daily.     Multiple Vitamins-Minerals (ALIVE ENERGY 50+ PO) Take 1 tablet by mouth daily.     Multiple Vitamins-Minerals (ZINC PO) Take by mouth.     pantoprazole (PROTONIX) 40 MG tablet TAKE 1 TABLET ONCE A DAY 30 MINUTES BEFORE BREAKFAST. 30 tablet 11   timolol (BETIMOL) 0.5 % ophthalmic solution 1 drop 2 (two) times daily.     Social History   Socioeconomic History   Marital status: Divorced    Spouse name: Not on file   Number of children: 7   Years of education: Not  on file   Highest education level: Not on file  Occupational History   Not on file  Tobacco Use   Smoking status: Former Smoker    Packs/day: 3.00    Years: 34.00    Pack years: 102.00    Quit date: 06/06/1989    Years since quitting: 30.2   Smokeless tobacco: Never Used  Substance and Sexual Activity   Alcohol use: No   Drug use: No   Sexual activity: Yes    Birth control/protection: None  Other Topics Concern   Not on file  Social History Narrative   Not on file   Social Determinants of Health   Financial Resource Strain:    Difficulty of Paying Living Expenses:   Food Insecurity:    Worried About Sales executive in the Last Year:    Arboriculturist in the Last Year:   Transportation Needs:    Film/video editor (Medical):    Lack of Transportation (Non-Medical):   Physical Activity:    Days of Exercise per Week:    Minutes of Exercise per Session:   Stress:    Feeling of Stress :   Social Connections:    Frequency of Communication with Friends and Family:    Frequency of Social Gatherings with Friends and Family:    Attends Religious Services:    Active Member of Clubs or Organizations:    Attends Music therapist:    Marital Status:   Intimate Partner Violence:    Fear of Current or Ex-Partner:    Emotionally Abused:    Physically Abused:    Sexually Abused:    Family History  Problem Relation Age of Onset   Colon cancer Neg Hx     OBJECTIVE:  Vitals:   08/13/19 1811  BP: (!) 148/74  Pulse: 70  Resp: 17  Temp: 98.6 F (37 C)  TempSrc: Tympanic  SpO2: 95%     General appearance: alert; appears fatigued, but nontoxic; speaking in full sentences and tolerating own secretions HEENT: NCAT; Ears: EACs clear, TMs pearly gray; Eyes: PERRL.  EOM grossly intact. Sinuses: mild diffuse tenderness; Nose: nares patent without rhinorrhea, Throat: oropharynx clear, tonsils non erythematous or enlarged, uvula midline  Neck: supple without LAD Lungs: unlabored respirations, symmetrical air entry; cough: absent; no respiratory distress; CTAB Heart: regular rate and rhythm.   Skin: warm and dry Psychological: alert and cooperative; normal mood and affect   ASSESSMENT & PLAN:  1. Acute non-recurrent sinusitis, unspecified location   2. Viral URI     Meds ordered this encounter  Medications   doxycycline (VIBRAMYCIN) 100 MG capsule    Sig: Take 1 capsule (100 mg total) by mouth 2 (two) times daily.    Dispense:  20 capsule    Refill:  0    Order Specific Question:   Supervising Provider    Answer:   Raylene Everts [id number]   COVID  testing ordered.  It will take between 2-5 days for test results.  Someone will contact you regarding abnormal results.    In the meantime: You should remain isolated in your home for 10 days from symptom onset AND greater than 72 hours after symptoms resolution (absence of fever without the use of fever-reducing medication and improvement in respiratory symptoms), whichever is longer Get plenty of rest and push fluids Doxycycline prescribed for sinus infection.  Take as directed and to completion Use OTC antihistamine for nasal congestion, runny nose, and/or  sore throat Use OTC flonase for nasal congestion and runny nose Use medications daily for symptom relief Use OTC medications like ibuprofen or tylenol as needed fever or pain Call or go to the ED if you have any new or worsening symptoms such as fever, cough, shortness of breath, chest tightness, chest pain, turning blue, changes in mental status, etc...   Reviewed expectations re: course of current medical issues. Questions answered. Outlined signs and symptoms indicating need for more acute intervention. Patient verbalized understanding. After Visit Summary given.         Lestine Box, PA-C 08/13/19 1829

## 2019-08-14 LAB — NOVEL CORONAVIRUS, NAA: SARS-CoV-2, NAA: NOT DETECTED

## 2019-08-14 LAB — SARS-COV-2, NAA 2 DAY TAT

## 2019-08-30 ENCOUNTER — Encounter (HOSPITAL_COMMUNITY): Payer: Self-pay | Admitting: Emergency Medicine

## 2019-08-30 ENCOUNTER — Other Ambulatory Visit: Payer: Self-pay

## 2019-08-30 ENCOUNTER — Emergency Department (HOSPITAL_COMMUNITY): Payer: Medicare Other

## 2019-08-30 ENCOUNTER — Emergency Department (HOSPITAL_COMMUNITY)
Admission: EM | Admit: 2019-08-30 | Discharge: 2019-08-30 | Disposition: A | Discharge status: Home / Self Care | Payer: Medicare Other | Attending: Emergency Medicine | Admitting: Emergency Medicine

## 2019-08-30 DIAGNOSIS — W57XXXA Bitten or stung by nonvenomous insect and other nonvenomous arthropods, initial encounter: Secondary | ICD-10-CM

## 2019-08-30 DIAGNOSIS — Z79899 Other long term (current) drug therapy: Secondary | ICD-10-CM | POA: Diagnosis not present

## 2019-08-30 DIAGNOSIS — Z87891 Personal history of nicotine dependence: Secondary | ICD-10-CM | POA: Insufficient documentation

## 2019-08-30 DIAGNOSIS — N200 Calculus of kidney: Secondary | ICD-10-CM | POA: Diagnosis not present

## 2019-08-30 DIAGNOSIS — R109 Unspecified abdominal pain: Secondary | ICD-10-CM

## 2019-08-30 DIAGNOSIS — S30861A Insect bite (nonvenomous) of abdominal wall, initial encounter: Secondary | ICD-10-CM | POA: Diagnosis not present

## 2019-08-30 LAB — CBC WITH DIFFERENTIAL/PLATELET
Abs Immature Granulocytes: 0.01 10*3/uL (ref 0.00–0.07)
Basophils Absolute: 0.1 10*3/uL (ref 0.0–0.1)
Basophils Relative: 1 %
Eosinophils Absolute: 0.4 10*3/uL (ref 0.0–0.5)
Eosinophils Relative: 5 %
HCT: 45.5 % (ref 39.0–52.0)
Hemoglobin: 15.2 g/dL (ref 13.0–17.0)
Immature Granulocytes: 0 %
Lymphocytes Relative: 47 %
Lymphs Abs: 3.2 10*3/uL (ref 0.7–4.0)
MCH: 32.3 pg (ref 26.0–34.0)
MCHC: 33.4 g/dL (ref 30.0–36.0)
MCV: 96.6 fL (ref 80.0–100.0)
Monocytes Absolute: 0.7 10*3/uL (ref 0.1–1.0)
Monocytes Relative: 11 %
Neutro Abs: 2.4 10*3/uL (ref 1.7–7.7)
Neutrophils Relative %: 36 %
Platelets: 208 10*3/uL (ref 150–400)
RBC: 4.71 MIL/uL (ref 4.22–5.81)
RDW: 11.9 % (ref 11.5–15.5)
WBC: 6.8 10*3/uL (ref 4.0–10.5)
nRBC: 0 % (ref 0.0–0.2)

## 2019-08-30 LAB — URINALYSIS, ROUTINE W REFLEX MICROSCOPIC
Bilirubin Urine: NEGATIVE
Glucose, UA: NEGATIVE mg/dL
Hgb urine dipstick: NEGATIVE
Ketones, ur: NEGATIVE mg/dL
Leukocytes,Ua: NEGATIVE
Nitrite: NEGATIVE
Protein, ur: NEGATIVE mg/dL
Specific Gravity, Urine: 1.005 (ref 1.005–1.030)
pH: 7 (ref 5.0–8.0)

## 2019-08-30 LAB — BASIC METABOLIC PANEL
Anion gap: 8 (ref 5–15)
BUN: 11 mg/dL (ref 8–23)
CO2: 28 mmol/L (ref 22–32)
Calcium: 9.1 mg/dL (ref 8.9–10.3)
Chloride: 104 mmol/L (ref 98–111)
Creatinine, Ser: 1 mg/dL (ref 0.61–1.24)
GFR calc Af Amer: >60 mL/min (ref >60)
GFR calc non Af Amer: >60 mL/min (ref >60)
Glucose, Bld: 91 mg/dL (ref 70–99)
Potassium: 4.7 mmol/L (ref 3.5–5.1)
Sodium: 140 mmol/L (ref 135–145)

## 2019-08-30 MED ORDER — VALACYCLOVIR HCL 1 G PO TABS
1000.0000 mg | ORAL_TABLET | Freq: Three times a day (TID) | ORAL | 0 refills | Status: AC
Start: 2019-08-30 — End: 2019-09-06

## 2019-08-30 MED ORDER — HYDROCODONE-ACETAMINOPHEN 5-325 MG PO TABS: 1.0000 | ORAL_TABLET | Freq: Two times a day (BID) | ORAL | 0 refills | Status: AC | PRN

## 2019-08-30 MED ORDER — DOXYCYCLINE HYCLATE 100 MG PO CAPS: 100.0000 mg | ORAL_CAPSULE | Freq: Two times a day (BID) | ORAL | 0 refills | Status: AC

## 2019-08-30 NOTE — ED Triage Notes (Signed)
Pt c/o of right sided flank pain.  Pt got bit by a tick 5 days ago

## 2019-08-30 NOTE — ED Notes (Signed)
Pt ambulatory to waiting room. Pt verbalized understanding of discharge instructions.   

## 2019-08-30 NOTE — Discharge Instructions (Signed)
Please take medications as prescribed.  Prescription given for Norco. Take medication as directed and do not operate machinery, drive a car, or work while taking this medication as it can make you drowsy.   Please follow up with your primary care provider within 5-7 days for re-evaluation of your symptoms. If you do not have a primary care provider, information for a healthcare clinic has been provided for you to make arrangements for follow up care. Please return to the emergency department for any new or worsening symptoms.

## 2019-08-30 NOTE — ED Provider Notes (Addendum)
[facility] EM[facility] DEPART[facility] Provider Note   CSN: [id number] Arrival date & time: 08/30/19  1732     History Chief Complaint  Patient presents with  . Flank Pain    John Melendez is a 74 y.o. male.  HPI   74-year-old male with a history of anxiety, arthritis, depression, GERD, glaucoma, nephrolithiasis, hypertension, hypothyroidism, sleep apnea, who presents the emergency department today complaining of right flank pain.  Patient states he has pain to the right lower back that radiates to the right side of the abdomen.  He has skin lesion to the right lower back that is painful to touch and is somewhat erythematous.  He is also been feeling nauseated and more tired for the last few days.  Has not had any vomiting, fevers, diarrhea, chest pain shortness of breath or cough. Denies any urinary sxs. States sxs feel different from prior kidney stones.   He does note that he got bit by some tics a few days ago on the left side of his back and to the genital area.  He thought he had a bump to the genital area that he was concerned about.  Past Medical History:  Diagnosis Date  . Anxiety   . Arthritis   . Depression   . GERD (gastroesophageal reflux disease)    no PPI currently, symptoms controlled with diet and behavior  . Glaucoma   . History of kidney stones   . Hypertension   . Hypothyroidism   . Sleep apnea     Patient Active Problem List   Diagnosis Date Noted  . Incisional hernia, without obstruction or gangrene   . History of colonic polyps   . Diverticulosis of colon without hemorrhage   . Hiatal hernia   . Abdominal pain, epigastric 01/26/2014  . Encounter for screening colonoscopy 10/27/2013    Past Surgical History:  Procedure Laterality Date  . BACK SURGERY     ruptured lumbar disc.  . CHOLECYSTECTOMY    . COLONOSCOPY  02/15/14   RMR:anal papilla internal hemorrhoids/colonic diverticulosis/multiple colonic polyp  . ESOPHAGOGASTRODUODENOSCOPY  2004   Dr.  Rourk: non-critical Schatzki's ring s/p dilation with 56 F, small hiatal hernia, normal D1 and D2   . ESOPHAGOGASTRODUODENOSCOPY  02/15/14   RMR:noncritical schatzki's ring not manipulated/hiatal hernia  . INCISIONAL HERNIA REPAIR N/A 06/10/2017   Procedure: HERNIA REPAIR INCISIONAL WITH MESH;  Surgeon: Jenkins, Mark, MD;  Location: [facility];  Service: General;  Laterality: N/A;  . SLT LASER APPLICATION Right 06/13/2015   Procedure: SLT LASER APPLICATION;  Surgeon: Carroll F Haines, MD;  Location: [facility];  Service: Ophthalmology;  Laterality: Right;       Family History  Problem Relation Age of Onset  . Colon cancer Neg Hx     Social History   Tobacco Use  . Smoking status: Former Smoker    Packs/day: 3.00    Years: 34.00    Pack years: 102.00    Quit date: 06/06/1989    Years since quitting: 30.2  . Smokeless tobacco: Never Used  Vaping Use  . Vaping Use: Never used  Substance Use Topics  . Alcohol use: No  . Drug use: No    Home Medications Prior to Admission medications   Medication Sig Start Date End Date Taking? Authorizing Provider  ALPRAZolam (XANAX) 0.5 MG tablet Take 0.5 mg by mouth 3 (three) times daily.    Yes [provider]  brimonidine (ALPHAGAN) 0.2 % ophthalmic solution Place 1 drop into both   eyes 2 (two) times daily.    Yes [provider]  calcium carbonate (TUMS EX) 750 MG chewable tablet Chew 1 tablet by mouth daily as needed for heartburn.    Yes [provider]  Cholecalciferol (VITAMIN D3) 50 MCG (2000 UT) capsule Take 2,000 Units by mouth daily.   Yes [provider]  citalopram (CELEXA) 40 MG tablet Take 20 mg by mouth daily.  03/28/19  Yes [provider]  latanoprost (XALATAN) 0.005 % ophthalmic solution Place 1 drop into both eyes at bedtime.    Yes [provider]  levothyroxine (SYNTHROID) 88 MCG tablet Take 88 mcg by mouth daily before breakfast.    Yes [provider]  lisinopril  (PRINIVIL,ZESTRIL) 10 MG tablet Take 10 mg by mouth at bedtime.    Yes [provider]  loratadine (CLARITIN) 10 MG tablet Take 10 mg by mouth daily.   Yes [provider]  Multiple Vitamin (MULTIVITAMIN WITH MINERALS) TABS tablet Take 1 tablet by mouth daily.   Yes [provider]  pantoprazole (PROTONIX) 40 MG tablet TAKE 1 TABLET ONCE A DAY 30 MINUTES BEFORE BREAKFAST. Patient taking differently: Take 40 mg by mouth every morning.  06/16/14  Yes Mahala Menghini, PA-C  QUERCETIN PO Take 1 tablet by mouth daily.   Yes [provider]  doxycycline (VIBRAMYCIN) 100 MG capsule Take 1 capsule (100 mg total) by mouth 2 (two) times daily for 7 days. 08/30/19 09/06/19  Coila Wardell S, PA-C  HYDROcodone-acetaminophen (NORCO/VICODIN) 5-325 MG tablet Take 1 tablet by mouth every 12 (twelve) hours as needed. 08/30/19   Marwa Fuhrman S, PA-C  valACYclovir (VALTREX) 1000 MG tablet Take 1 tablet (1,000 mg total) by mouth 3 (three) times daily for 7 days. 08/30/19 09/06/19  Maurice Fotheringham S, PA-C    Allergies    Penicillins  Review of Systems   Review of Systems  Constitutional: Positive for fatigue. Negative for fever.  HENT: Negative for ear pain and sore throat.   Eyes: Negative for visual disturbance.  Respiratory: Negative for cough and shortness of breath.   Cardiovascular: Negative for chest pain.  Gastrointestinal: Positive for nausea. Negative for abdominal pain, constipation, diarrhea and vomiting.  Genitourinary: Positive for flank pain. Negative for dysuria and hematuria.  Musculoskeletal: Negative for back pain.  Skin: Positive for color change. Negative for rash.       lesion  Neurological: Negative for headaches.  All other systems reviewed and are negative.   Physical Exam Updated Vital Signs BP (!) 168/97 (BP Location: Right Arm)   Pulse (!) 55   Temp 98.6 F (37 C) (Oral)   Resp 17   Ht 6' 1" (1.854 m)   Wt 95.3 kg   SpO2 95%   BMI 27.71  kg/m   Physical Exam Vitals and nursing note reviewed.  Constitutional:      Appearance: He is well-developed.  HENT:     Head: Normocephalic and atraumatic.  Eyes:     Conjunctiva/sclera: Conjunctivae normal.  Cardiovascular:     Rate and Rhythm: Normal rate and regular rhythm.     Heart sounds: No murmur heard.   Pulmonary:     Effort: Pulmonary effort is normal. No respiratory distress.     Breath sounds: Normal breath sounds.  Abdominal:     General: Bowel sounds are normal.     Palpations: Abdomen is soft.     Tenderness: There is no guarding or rebound.     Comments: Mild  right flank TTP   Genitourinary:    Comments: Chaperone present. Normal, uncircumcised penis. Two vesicular lesions to the right pelvic area and several small superficial lesions noted in a linear formation just lateral to this. No TTP, erythema, or induration to the scrotum/testes. Musculoskeletal:     Cervical back: Neck supple.  Skin:    General: Skin is warm and dry.     Comments: Skin lesions to genital area as noted above. Additionally, 2cm  X 2cm area of erythema to the right lower back that is TTP.  Neurological:     Mental Status: He is alert.     ED Results / Procedures / Treatments   Labs (all labs ordered are listed, but only abnormal results are displayed) Labs Reviewed  URINALYSIS, ROUTINE W REFLEX MICROSCOPIC - Abnormal; Notable for the following components:      Result Value   Color, Urine STRAW (*)    All other components within normal limits  CBC WITH DIFFERENTIAL/PLATELET  BASIC METABOLIC PANEL    EKG None  Radiology CT Renal Stone Study  Result Date: 08/30/2019 CLINICAL DATA:  Right-sided flank pain. EXAM: CT ABDOMEN AND PELVIS WITHOUT CONTRAST TECHNIQUE: Multidetector CT imaging of the abdomen and pelvis was performed following the standard protocol without IV contrast. COMPARISON:  CT abdomen pelvis dated 02/24/2014 FINDINGS: Lower chest: No acute abnormality.  Hepatobiliary: The liver is hypoattenuating, consistent with hepatic steatosis. No focal liver abnormality is seen. Status post cholecystectomy. No biliary dilatation. Pancreas: Unremarkable. No pancreatic ductal dilatation or surrounding inflammatory changes. Spleen: Normal in size without focal abnormality. Adrenals/Urinary Tract: Adrenal glands are unremarkable. A nonobstructing right renal calculus measures 3 mm. No left renal calculi are noted. Otherwise, the kidneys are normal, without focal lesion or hydronephrosis. Bladder is unremarkable. Stomach/Bowel: Stomach is within normal limits. Appendix appears normal. There is colonic diverticulosis without evidence of diverticulitis. No evidence of bowel wall thickening, distention, or inflammatory changes. Vascular/Lymphatic: Aortic atherosclerosis. No enlarged abdominal or pelvic lymph nodes. Reproductive: Prostate is unremarkable. Other: No abdominal wall hernia or abnormality. No abdominopelvic ascites. Musculoskeletal: No acute or significant osseous findings. IMPRESSION: 1. No findings to explain the patient's symptoms. 2. Hepatic steatosis. 3. Nonobstructing right renal calculus. Aortic Atherosclerosis (ICD10-I70.0). Electronically Signed   By: Tyler  Litton M.D.   On: 08/30/2019 20:04    Procedures Procedures (including critical care time)  Medications Ordered in ED Medications - No data to display  ED Course  I have reviewed the triage vital signs and the nursing notes.  Pertinent labs & imaging results that were available during my care of the patient were reviewed by me and considered in my medical decision making (see chart for details).    MDM Rules/Calculators/A&P                          74-year-old male presenting with pain to the right flank area that radiates to the right abdomen.  He is also been feeling more fatigued and nauseated over the last couple of days and had multiple tick bites earlier this week.  On exam does have  some skin changes that are suggestive of shingles.  Work-up was initiated with laboratory work which showed normal CMP and normal be met.  UA was negative for UTI.  CT scan was obtained to rule out nephrolithiasis which did not show any evidence of this.  Suspect that his symptoms are related to shingles and will start on antivirals.  We   will also give some pain medications for his symptoms.  Given his multiple tick bites we will also give Rx for doxycycline to cover for any possible tickborne illness.  Have advised to follow-up with PCP next week for reassessment and return to the emergency department for new or worsening symptoms.  He and his daughter at bedside voiced understanding of the plan and reasons to return.  All questions answered.  Patient stable for discharge.  Patient seen in conjunction with Dr. Roderic Palau who personally evaluated the patient and is in agreement with this plan.  Final Clinical Impression(s) / ED Diagnoses Final diagnoses:  Flank pain  Tick bite, initial encounter    Rx / DC Orders ED Discharge Orders         Ordered    valACYclovir (VALTREX) 1000 MG tablet  3 times daily     Discontinue  Reprint     08/30/19 2208    doxycycline (VIBRAMYCIN) 100 MG capsule  2 times daily     Discontinue  Reprint     08/30/19 2208    HYDROcodone-acetaminophen (NORCO/VICODIN) 5-325 MG tablet  Every 12 hours PRN     Discontinue  Reprint     08/30/19 2208           Rodney Booze, PA-C 08/30/19 2209    Kaylena Pacifico S, PA-C 08/30/19 2239    Milton Ferguson, MD 08/31/19 1036

## 2019-09-09 DIAGNOSIS — B359 Dermatophytosis, unspecified: Secondary | ICD-10-CM | POA: Diagnosis not present

## 2019-09-09 DIAGNOSIS — Z1389 Encounter for screening for other disorder: Secondary | ICD-10-CM | POA: Diagnosis not present

## 2019-09-09 DIAGNOSIS — Z0001 Encounter for general adult medical examination with abnormal findings: Secondary | ICD-10-CM | POA: Diagnosis not present

## 2019-09-29 ENCOUNTER — Ambulatory Visit: Payer: Medicare Other | Admitting: Cardiology

## 2019-10-06 DIAGNOSIS — H401133 Primary open-angle glaucoma, bilateral, severe stage: Secondary | ICD-10-CM | POA: Diagnosis not present

## 2019-10-06 DIAGNOSIS — Z9889 Other specified postprocedural states: Secondary | ICD-10-CM | POA: Diagnosis not present

## 2019-10-06 DIAGNOSIS — H0102B Squamous blepharitis left eye, upper and lower eyelids: Secondary | ICD-10-CM | POA: Diagnosis not present

## 2019-10-06 DIAGNOSIS — H25813 Combined forms of age-related cataract, bilateral: Secondary | ICD-10-CM | POA: Diagnosis not present

## 2019-10-06 DIAGNOSIS — H0102A Squamous blepharitis right eye, upper and lower eyelids: Secondary | ICD-10-CM | POA: Diagnosis not present

## 2019-10-14 DIAGNOSIS — H903 Sensorineural hearing loss, bilateral: Secondary | ICD-10-CM | POA: Diagnosis not present

## 2019-11-05 DIAGNOSIS — H2511 Age-related nuclear cataract, right eye: Secondary | ICD-10-CM | POA: Diagnosis not present

## 2019-11-05 DIAGNOSIS — H401113 Primary open-angle glaucoma, right eye, severe stage: Secondary | ICD-10-CM | POA: Diagnosis not present

## 2019-11-29 DIAGNOSIS — H2512 Age-related nuclear cataract, left eye: Secondary | ICD-10-CM | POA: Diagnosis not present

## 2019-12-24 DIAGNOSIS — H401123 Primary open-angle glaucoma, left eye, severe stage: Secondary | ICD-10-CM | POA: Diagnosis not present

## 2019-12-24 DIAGNOSIS — H268 Other specified cataract: Secondary | ICD-10-CM | POA: Diagnosis not present

## 2019-12-24 DIAGNOSIS — H2511 Age-related nuclear cataract, right eye: Secondary | ICD-10-CM | POA: Diagnosis not present

## 2020-02-02 DIAGNOSIS — Z0100 Encounter for examination of eyes and vision without abnormal findings: Secondary | ICD-10-CM | POA: Diagnosis not present

## 2020-02-16 DIAGNOSIS — J019 Acute sinusitis, unspecified: Secondary | ICD-10-CM | POA: Diagnosis not present

## 2020-02-16 DIAGNOSIS — R69 Illness, unspecified: Secondary | ICD-10-CM | POA: Diagnosis not present

## 2020-02-16 DIAGNOSIS — Z6828 Body mass index (BMI) 28.0-28.9, adult: Secondary | ICD-10-CM | POA: Diagnosis not present

## 2020-02-16 DIAGNOSIS — E663 Overweight: Secondary | ICD-10-CM | POA: Diagnosis not present

## 2020-03-26 ENCOUNTER — Ambulatory Visit
Admission: EM | Admit: 2020-03-26 | Discharge: 2020-03-26 | Disposition: A | Discharge status: Home / Self Care | Payer: Medicare HMO

## 2020-03-26 ENCOUNTER — Encounter: Payer: Self-pay | Admitting: Emergency Medicine

## 2020-03-26 ENCOUNTER — Emergency Department (HOSPITAL_COMMUNITY): Payer: Medicare HMO

## 2020-03-26 ENCOUNTER — Other Ambulatory Visit: Payer: Self-pay

## 2020-03-26 ENCOUNTER — Encounter (HOSPITAL_COMMUNITY): Payer: Self-pay | Admitting: Emergency Medicine

## 2020-03-26 ENCOUNTER — Emergency Department (HOSPITAL_COMMUNITY)
Admission: EM | Admit: 2020-03-26 | Discharge: 2020-03-26 | Disposition: A | Discharge status: Home / Self Care | Payer: Medicare HMO | Attending: Emergency Medicine | Admitting: Emergency Medicine

## 2020-03-26 DIAGNOSIS — Z87891 Personal history of nicotine dependence: Secondary | ICD-10-CM | POA: Diagnosis not present

## 2020-03-26 DIAGNOSIS — M25561 Pain in right knee: Secondary | ICD-10-CM | POA: Insufficient documentation

## 2020-03-26 DIAGNOSIS — M79661 Pain in right lower leg: Secondary | ICD-10-CM

## 2020-03-26 DIAGNOSIS — I10 Essential (primary) hypertension: Secondary | ICD-10-CM | POA: Insufficient documentation

## 2020-03-26 DIAGNOSIS — E039 Hypothyroidism, unspecified: Secondary | ICD-10-CM | POA: Insufficient documentation

## 2020-03-26 DIAGNOSIS — M791 Myalgia, unspecified site: Secondary | ICD-10-CM | POA: Diagnosis not present

## 2020-03-26 MED ORDER — DICLOFENAC SODIUM 1 % EX GEL: 2.0000 g | Freq: Four times a day (QID) | CUTANEOUS | 0 refills | Status: AC | PRN

## 2020-03-26 NOTE — ED Triage Notes (Signed)
Pain behind RT knee and swelling to RT knee for past couple days.

## 2020-03-26 NOTE — ED Provider Notes (Addendum)
[place]   [id number] 03/26/20 Arrival Time: [id number]   Chief Complaint  Patient presents with  . Leg Pain     SUBJECTIVE: History from: patient.  John Melendez is a 75 y.o. male who presented to the urgent care with a complaint of pain in right calf muscle for the past few days.  Denies any precipitating event, trauma or injury.  Localized pain to the right calf muscle.  He describes the pain as constant and achy.  He has tried OTC medications with mild  relief.  Denies alleviating or aggravating factors.  He denies similar symptoms in the past.  Denies chills, fever, nausea, vomiting, diarrhea   ROS: As per HPI.  All other pertinent ROS negative.      Past Medical History:  Diagnosis Date  . Anxiety   . Arthritis   . Depression   . GERD (gastroesophageal reflux disease)    no PPI currently, symptoms controlled with diet and behavior  . Glaucoma   . History of kidney stones   . Hypertension   . Hypothyroidism   . Sleep apnea    Past Surgical History:  Procedure Laterality Date  . BACK SURGERY     ruptured lumbar disc.  Marland Kitchen CHOLECYSTECTOMY    . COLONOSCOPY  02/15/14   [id number] papilla internal hemorrhoids/colonic diverticulosis/multiple colonic polyp  . ESOPHAGOGASTRODUODENOSCOPY  2004   Dr. Gala Romney: non-critical Schatzki's ring s/p dilation with 79 F, small hiatal hernia, normal D1 and D2   . ESOPHAGOGASTRODUODENOSCOPY  02/15/14   [id number] schatzki's ring not manipulated/hiatal hernia  . INCISIONAL HERNIA REPAIR N/A 06/10/2017   Procedure: HERNIA REPAIR INCISIONAL WITH MESH;  Surgeon: Aviva Signs, MD;  Location: [facility];  Service: General;  Laterality: N/A;  . SLT LASER APPLICATION Right [id number]   Procedure: SLT LASER APPLICATION;  Surgeon: Williams Che, MD;  Location: [facility];  Service: Ophthalmology;  Laterality: Right;   Allergies  Allergen Reactions  . Penicillins Nausea Only   No current facility-administered medications on file prior to  encounter.   Current Outpatient Medications on File Prior to Encounter  Medication Sig Dispense Refill  . ALPRAZolam (XANAX) 0.5 MG tablet Take 0.5 mg by mouth 3 (three) times daily.     . brimonidine (ALPHAGAN) 0.2 % ophthalmic solution Place 1 drop into both eyes 2 (two) times daily.     . calcium carbonate (TUMS EX) 750 MG chewable tablet Chew 1 tablet by mouth daily as needed for heartburn.     . Cholecalciferol (VITAMIN D3) 50 MCG (2000 UT) capsule Take 2,000 Units by mouth daily.    . citalopram (CELEXA) 40 MG tablet Take 20 mg by mouth daily.     Marland Kitchen HYDROcodone-acetaminophen (NORCO/VICODIN) 5-325 MG tablet Take 1 tablet by mouth every 12 (twelve) hours as needed. 6 tablet 0  . latanoprost (XALATAN) 0.005 % ophthalmic solution Place 1 drop into both eyes at bedtime.     Marland Kitchen levothyroxine (SYNTHROID) 88 MCG tablet Take 88 mcg by mouth daily before breakfast.     . lisinopril (PRINIVIL,ZESTRIL) 10 MG tablet Take 10 mg by mouth at bedtime.     Marland Kitchen loratadine (CLARITIN) 10 MG tablet Take 10 mg by mouth daily.    . Multiple Vitamin (MULTIVITAMIN WITH MINERALS) TABS tablet Take 1 tablet by mouth daily.    . pantoprazole (PROTONIX) 40 MG tablet TAKE 1 TABLET ONCE A DAY 30 MINUTES BEFORE BREAKFAST. (Patient taking differently: Take 40 mg by mouth every morning. ) 30 tablet  11  . QUERCETIN PO Take 1 tablet by mouth daily.     Social History   Socioeconomic History  . Marital status: Divorced    Spouse name: Not on file  . Number of children: 7  . Years of education: Not on file  . Highest education level: Not on file  Occupational History  . Not on file  Tobacco Use  . Smoking status: Former Smoker    Packs/day: 3.00    Years: 34.00    Pack years: 102.00    Quit date: 06/06/1989    Years since quitting: 30.8  . Smokeless tobacco: Never Used  Vaping Use  . Vaping Use: Never used  Substance and Sexual Activity  . Alcohol use: No  . Drug use: No  . Sexual activity: Yes    Birth  control/protection: None  Other Topics Concern  . Not on file  Social History Narrative  . Not on file   Social Determinants of Health   Financial Resource Strain: Not on file  Food Insecurity: Not on file  Transportation Needs: Not on file  Physical Activity: Not on file  Stress: Not on file  Social Connections: Not on file  Intimate Partner Violence: Not on file   Family History  Problem Relation Age of Onset  . Colon cancer Neg Hx     OBJECTIVE:  Vitals:   03/26/20 1536 03/26/20 1538  BP:  (!) 162/86  Pulse:  62  Resp:  19  Temp:  97.9 F (36.6 C)  TempSrc:  Oral  SpO2:  95%  Weight: 210 lb (95.3 kg)   Height: 6\' 1"  (1.854 m)      Physical Exam Vitals and nursing note reviewed.  Constitutional:      General: He is not in acute distress.    Appearance: Normal appearance. He is normal weight. He is not ill-appearing, toxic-appearing or diaphoretic.  Cardiovascular:     Rate and Rhythm: Normal rate and regular rhythm.     Pulses: Normal pulses.     Heart sounds: Normal heart sounds. No murmur heard. No friction rub. No gallop.   Pulmonary:     Effort: Pulmonary effort is normal. No respiratory distress.     Breath sounds: Normal breath sounds. No stridor. No wheezing, rhonchi or rales.  Chest:     Chest wall: No tenderness.  Musculoskeletal:     Comments: Right knee is without any obvious asymmetry or deformity when compared to the left knee.  There is no warmth, swelling, erythema or lesion present.  Tenderness on massage of calf muscle.  Neurovascular status intact.  Neurological:     Mental Status: He is alert and oriented to person, place, and time.      LABS:  No results found for this or any previous visit (from the past 24 hour(s)).   ASSESSMENT & PLAN:  1. Muscle pain   2. Right calf pain     No orders of the defined types were placed in this encounter.  Patient is stable at discharge.  He was advised to go to the ER for further  evaluation to rule out DVT.  He declined.  He is stable to follow-up with PCP on Monday to have an ultrasound ordered instead.   Discharge Instructions  Please go to ER for further evaluation to rule out deep vein thrombosis.  Reviewed expectations re: course of current medical issues. Questions answered. Outlined signs and symptoms indicating need for more acute intervention. Patient verbalized understanding.  After Visit Summary given.         Emerson Monte, FNP 03/26/20 1554    Emerson Monte, FNP 03/26/20 1555

## 2020-03-26 NOTE — Discharge Instructions (Signed)
Please go to ER for further evaluation to rule out deep vein thrombosis.

## 2020-03-26 NOTE — ED Triage Notes (Signed)
Pt c/o pain behind the rt knee with swelling x2 days.

## 2020-03-26 NOTE — Discharge Instructions (Addendum)
IMPORTANT PATIENT INSTRUCTIONS:  Your ED provider has recommended an Outpatient Ultrasound.  Please call [phone] to schedule an appointment.  If your appointment is scheduled for a Saturday, Sunday or holiday, please go to the [facility] Emergency Department Registration Desk at least 15 minutes prior to your appointment time and tell them you are there for an ultrasound.    If your appointment is scheduled for a weekday (Monday-Friday), please go directly to the [facility] Radiology Department at least 15 minutes prior to your appointment time and tell them you are there for an ultrasound.  Please call [phone] with questions.

## 2020-03-26 NOTE — ED Provider Notes (Signed)
Emergency Department Provider Note   I have reviewed the triage vital signs and the nursing notes.   HISTORY  Chief Complaint Knee Pain   HPI John Melendez is a 75 y.o. male presents to the emergency department for evaluation of right knee pain with swelling over the past 2 days.  No known injury.  No redness, warmth, fever to the area.  He has pain mainly behind the knee and the calf on the right.  Denies any chest pain or shortness of breath.  Pain is worse with movement and walking on the area.  No radiation of symptoms or other modifying factors.   Past Medical History:  Diagnosis Date  . Anxiety   . Arthritis   . Depression   . GERD (gastroesophageal reflux disease)    no PPI currently, symptoms controlled with diet and behavior  . Glaucoma   . History of kidney stones   . Hypertension   . Hypothyroidism   . Sleep apnea     Patient Active Problem List   Diagnosis Date Noted  . Incisional hernia, without obstruction or gangrene   . History of colonic polyps   . Diverticulosis of colon without hemorrhage   . Hiatal hernia   . Abdominal pain, epigastric 01/26/2014  . Encounter for screening colonoscopy 10/27/2013    Past Surgical History:  Procedure Laterality Date  . BACK SURGERY     ruptured lumbar disc.  Marland Kitchen CHOLECYSTECTOMY    . COLONOSCOPY  02/15/14   [id number] papilla internal hemorrhoids/colonic diverticulosis/multiple colonic polyp  . ESOPHAGOGASTRODUODENOSCOPY  2004   Dr. Gala Romney: non-critical Schatzki's ring s/p dilation with 38 F, small hiatal hernia, normal D1 and D2   . ESOPHAGOGASTRODUODENOSCOPY  02/15/14   [id number] schatzki's ring not manipulated/hiatal hernia  . INCISIONAL HERNIA REPAIR N/A 06/10/2017   Procedure: HERNIA REPAIR INCISIONAL WITH MESH;  Surgeon: Aviva Signs, MD;  Location: [facility];  Service: General;  Laterality: N/A;  . SLT LASER APPLICATION Right 05/15/91   Procedure: SLT LASER APPLICATION;  Surgeon: Williams Che, MD;   Location: [facility];  Service: Ophthalmology;  Laterality: Right;    Allergies Penicillins  Family History  Problem Relation Age of Onset  . Colon cancer Neg Hx     Social History Social History   Tobacco Use  . Smoking status: Former Smoker    Packs/day: 3.00    Years: 34.00    Pack years: 102.00    Quit date: 06/06/1989    Years since quitting: 30.8  . Smokeless tobacco: Never Used  Vaping Use  . Vaping Use: Never used  Substance Use Topics  . Alcohol use: No  . Drug use: No    Review of Systems  Constitutional: No fever/chills Cardiovascular: Denies chest pain. Respiratory: Denies shortness of breath. Gastrointestinal: No abdominal pain.   Musculoskeletal: Positive right knee pain.  Skin: Negative for rash. Neurological: Negative for headaches, focal weakness or numbness.  10-point ROS otherwise negative.  ____________________________________________   PHYSICAL EXAM:  VITAL SIGNS: ED Triage Vitals  Enc Vitals Group     BP 03/26/20 1709 (!) 182/97     Pulse Rate 03/26/20 1709 73     Resp 03/26/20 1709 20     Temp 03/26/20 1709 97.6 F (36.4 C)     Temp Source 03/26/20 1709 Oral     SpO2 03/26/20 1709 97 %     Weight 03/26/20 1706 210 lb (95.3 kg)     Height 03/26/20 1706 6\' 1"  (1.854  m)   Constitutional: Alert and oriented. Well appearing and in no acute distress. Eyes: Conjunctivae are normal.  Head: Atraumatic. Nose: No congestion/rhinnorhea. Mouth/Throat: Mucous membranes are moist.   Neck: No stridor.   Cardiovascular: Good peripheral circulation.  Respiratory: Normal respiratory effort.   Gastrointestinal: No distention.  Musculoskeletal: No redness or warmth to the knees. Normal ROM. Right leg slightly larger than left. Normal ROM of the hip. Patient is ambulatory.  Neurologic:  Normal speech and language. Skin:  Skin is warm, dry and intact. No rash noted.  ____________________________________________  RADIOLOGY  DG Knee Complete 4  Views Right  Result Date: 03/26/2020 CLINICAL DATA:  Right knee pain and swelling for 2-3 days. No known injury. EXAM: RIGHT KNEE - COMPLETE 4+ VIEW COMPARISON:  None. FINDINGS: There is no acute bony or joint abnormality. Joint spaces are preserved. Tiny osteophytes are seen about the knee. There is a moderate joint effusion. Atherosclerosis noted. IMPRESSION: Mild osteoarthritis. Moderate joint effusion. No acute bony abnormality. Electronically Signed   By: Inge Rise M.D.   On: 03/26/2020 17:31    ____________________________________________   PROCEDURES  Procedure(s) performed:   Procedures  None  ____________________________________________   INITIAL IMPRESSION / ASSESSMENT AND PLAN / ED COURSE  Pertinent labs & imaging results that were available during my care of the patient were reviewed by me and considered in my medical decision making (see chart for details).   Patient presents emergency department for evaluation of pain with some mild swelling to the right knee.  Exam not consistent with septic joint.  Normal pulses in the leg so doubt ischemic type pain or claudication type symptoms.  Patient may benefit from DVT work-up as he was sent in by urgent care for this.  Unfortunately, we do not have DVT ultrasonographer here on the weekends.  We will start with plain film and schedule patient for return visit for DVT ultrasound tomorrow.   Plain films reviewed. Effusion noted on plain film. Suspect effusion is causing symptoms but will order outpatient DVT US. Patient to f/u in the coming 24 hours for testing. Discussed ED return precautions. No PE symptoms.  ____________________________________________  FINAL CLINICAL IMPRESSION(S) / ED DIAGNOSES  Final diagnoses:  Acute pain of right knee    NEW OUTPATIENT MEDICATIONS STARTED DURING THIS VISIT:  Discharge Medication List as of 03/26/2020  5:42 PM    START taking these medications   Details  diclofenac Sodium  (VOLTAREN) 1 % GEL Apply 2 g topically 4 (four) times daily as needed., Starting Sat 03/26/2020, Normal        Note:  This document was prepared using Dragon voice recognition software and may include unintentional dictation errors.  Nanda Quinton, MD, [facility] Emergency Medicine    Marylu Dudenhoeffer, [facility], MD 03/26/20 2312

## 2020-03-27 ENCOUNTER — Ambulatory Visit (HOSPITAL_COMMUNITY)
Admission: RE | Admit: 2020-03-27 | Discharge: 2020-03-27 | Disposition: A | Discharge status: Home / Self Care | Payer: Medicare HMO | Source: Ambulatory Visit | Attending: Emergency Medicine | Admitting: Emergency Medicine

## 2020-03-27 DIAGNOSIS — M7121 Synovial cyst of popliteal space [Baker], right knee: Secondary | ICD-10-CM | POA: Insufficient documentation

## 2020-03-27 DIAGNOSIS — M25561 Pain in right knee: Secondary | ICD-10-CM | POA: Diagnosis not present

## 2020-03-30 DIAGNOSIS — M1711 Unilateral primary osteoarthritis, right knee: Secondary | ICD-10-CM | POA: Diagnosis not present

## 2020-03-30 DIAGNOSIS — Z6829 Body mass index (BMI) 29.0-29.9, adult: Secondary | ICD-10-CM | POA: Diagnosis not present

## 2020-03-30 DIAGNOSIS — M25761 Osteophyte, right knee: Secondary | ICD-10-CM | POA: Diagnosis not present

## 2020-03-30 DIAGNOSIS — E663 Overweight: Secondary | ICD-10-CM | POA: Diagnosis not present

## 2020-05-03 DIAGNOSIS — Z9889 Other specified postprocedural states: Secondary | ICD-10-CM | POA: Diagnosis not present

## 2020-05-03 DIAGNOSIS — H0102A Squamous blepharitis right eye, upper and lower eyelids: Secondary | ICD-10-CM | POA: Diagnosis not present

## 2020-05-03 DIAGNOSIS — Z961 Presence of intraocular lens: Secondary | ICD-10-CM | POA: Diagnosis not present

## 2020-05-03 DIAGNOSIS — H43822 Vitreomacular adhesion, left eye: Secondary | ICD-10-CM | POA: Diagnosis not present

## 2020-05-03 DIAGNOSIS — H401133 Primary open-angle glaucoma, bilateral, severe stage: Secondary | ICD-10-CM | POA: Diagnosis not present

## 2020-05-03 DIAGNOSIS — H0102B Squamous blepharitis left eye, upper and lower eyelids: Secondary | ICD-10-CM | POA: Diagnosis not present

## 2020-05-10 DIAGNOSIS — M25561 Pain in right knee: Secondary | ICD-10-CM | POA: Diagnosis not present

## 2020-06-09 DIAGNOSIS — M25561 Pain in right knee: Secondary | ICD-10-CM | POA: Diagnosis not present

## 2020-07-01 DIAGNOSIS — M948X5 Other specified disorders of cartilage, thigh: Secondary | ICD-10-CM | POA: Diagnosis not present

## 2020-07-01 DIAGNOSIS — M25561 Pain in right knee: Secondary | ICD-10-CM | POA: Diagnosis not present

## 2020-07-01 DIAGNOSIS — M660 Rupture of popliteal cyst: Secondary | ICD-10-CM | POA: Diagnosis not present

## 2020-07-01 DIAGNOSIS — X58XXXA Exposure to other specified factors, initial encounter: Secondary | ICD-10-CM | POA: Diagnosis not present

## 2020-07-01 DIAGNOSIS — M25461 Effusion, right knee: Secondary | ICD-10-CM | POA: Diagnosis not present

## 2020-07-01 DIAGNOSIS — S83241A Other tear of medial meniscus, current injury, right knee, initial encounter: Secondary | ICD-10-CM | POA: Diagnosis not present

## 2020-07-06 DIAGNOSIS — M1909 Primary osteoarthritis, other specified site: Secondary | ICD-10-CM | POA: Diagnosis not present

## 2020-07-06 DIAGNOSIS — M2559 Pain in other specified joint: Secondary | ICD-10-CM | POA: Diagnosis not present

## 2020-07-20 DIAGNOSIS — Z01818 Encounter for other preprocedural examination: Secondary | ICD-10-CM | POA: Diagnosis not present

## 2020-07-20 DIAGNOSIS — M1909 Primary osteoarthritis, other specified site: Secondary | ICD-10-CM | POA: Diagnosis not present

## 2020-07-20 DIAGNOSIS — M2559 Pain in other specified joint: Secondary | ICD-10-CM | POA: Diagnosis not present

## 2020-07-26 DIAGNOSIS — Z9889 Other specified postprocedural states: Secondary | ICD-10-CM | POA: Diagnosis not present

## 2020-07-27 DIAGNOSIS — X58XXXA Exposure to other specified factors, initial encounter: Secondary | ICD-10-CM | POA: Diagnosis not present

## 2020-07-27 DIAGNOSIS — I10 Essential (primary) hypertension: Secondary | ICD-10-CM | POA: Diagnosis not present

## 2020-07-27 DIAGNOSIS — M94261 Chondromalacia, right knee: Secondary | ICD-10-CM | POA: Diagnosis not present

## 2020-07-27 DIAGNOSIS — G4730 Sleep apnea, unspecified: Secondary | ICD-10-CM | POA: Diagnosis not present

## 2020-07-27 DIAGNOSIS — S83241A Other tear of medial meniscus, current injury, right knee, initial encounter: Secondary | ICD-10-CM | POA: Diagnosis not present

## 2020-07-27 DIAGNOSIS — M7121 Synovial cyst of popliteal space [Baker], right knee: Secondary | ICD-10-CM | POA: Diagnosis not present

## 2020-07-27 DIAGNOSIS — Z79899 Other long term (current) drug therapy: Secondary | ICD-10-CM | POA: Diagnosis not present

## 2020-07-27 DIAGNOSIS — Z791 Long term (current) use of non-steroidal anti-inflammatories (NSAID): Secondary | ICD-10-CM | POA: Diagnosis not present

## 2020-07-27 DIAGNOSIS — E039 Hypothyroidism, unspecified: Secondary | ICD-10-CM | POA: Diagnosis not present

## 2020-07-27 DIAGNOSIS — S83231A Complex tear of medial meniscus, current injury, right knee, initial encounter: Secondary | ICD-10-CM | POA: Diagnosis not present

## 2020-07-27 DIAGNOSIS — M1990 Unspecified osteoarthritis, unspecified site: Secondary | ICD-10-CM | POA: Diagnosis not present

## 2020-07-27 DIAGNOSIS — Z87442 Personal history of urinary calculi: Secondary | ICD-10-CM | POA: Diagnosis not present

## 2020-09-05 DIAGNOSIS — S83241A Other tear of medial meniscus, current injury, right knee, initial encounter: Secondary | ICD-10-CM | POA: Diagnosis not present

## 2020-09-05 DIAGNOSIS — Z9889 Other specified postprocedural states: Secondary | ICD-10-CM | POA: Diagnosis not present

## 2020-09-19 DIAGNOSIS — E663 Overweight: Secondary | ICD-10-CM | POA: Diagnosis not present

## 2020-09-19 DIAGNOSIS — Z6828 Body mass index (BMI) 28.0-28.9, adult: Secondary | ICD-10-CM | POA: Diagnosis not present

## 2020-09-19 DIAGNOSIS — Z1389 Encounter for screening for other disorder: Secondary | ICD-10-CM | POA: Diagnosis not present

## 2020-09-19 DIAGNOSIS — R5383 Other fatigue: Secondary | ICD-10-CM | POA: Diagnosis not present

## 2020-09-19 DIAGNOSIS — R69 Illness, unspecified: Secondary | ICD-10-CM | POA: Diagnosis not present

## 2020-09-19 DIAGNOSIS — Z1331 Encounter for screening for depression: Secondary | ICD-10-CM | POA: Diagnosis not present

## 2020-09-19 DIAGNOSIS — R7309 Other abnormal glucose: Secondary | ICD-10-CM | POA: Diagnosis not present

## 2020-09-19 DIAGNOSIS — E063 Autoimmune thyroiditis: Secondary | ICD-10-CM | POA: Diagnosis not present

## 2020-09-28 ENCOUNTER — Ambulatory Visit: Payer: Self-pay | Admitting: Podiatry

## 2020-10-05 ENCOUNTER — Ambulatory Visit: Payer: Self-pay | Admitting: Podiatry

## 2020-10-12 ENCOUNTER — Other Ambulatory Visit: Payer: Self-pay

## 2020-10-12 ENCOUNTER — Encounter: Payer: Self-pay | Admitting: Podiatry

## 2020-10-12 ENCOUNTER — Ambulatory Visit (INDEPENDENT_AMBULATORY_CARE_PROVIDER_SITE_OTHER): Payer: Medicare HMO | Admitting: Podiatry

## 2020-10-12 DIAGNOSIS — M79675 Pain in left toe(s): Secondary | ICD-10-CM

## 2020-10-12 DIAGNOSIS — M779 Enthesopathy, unspecified: Secondary | ICD-10-CM | POA: Diagnosis not present

## 2020-10-12 DIAGNOSIS — M79674 Pain in right toe(s): Secondary | ICD-10-CM

## 2020-10-12 DIAGNOSIS — B351 Tinea unguium: Secondary | ICD-10-CM

## 2020-10-13 NOTE — Progress Notes (Signed)
Subjective:   Patient ID: John Melendez, male   DOB: 75 y.o.   MRN: [id number]   HPI Patient presents stating that he has worn orthotics for years but he lost his pair and he does have flatfeet and gets pain in his feet orthotics been very beneficial for him.  Patient wants to have new orthotics made and does not smoke currently and would like to stay active   Review of Systems  All other systems reviewed and are negative.      Objective:  Physical Exam Vitals and nursing note reviewed.  Constitutional:      Appearance: He is well-developed.  Pulmonary:     Effort: Pulmonary effort is normal.  Musculoskeletal:        General: Normal range of motion.  Skin:    General: Skin is warm.  Neurological:     Mental Status: He is alert.    Neurovascular status found to be intact currently with muscle strength adequate range of motion adequate moderate depression of the arch bilateral with some plantar tissue formation secondary to pressure points.  Patient is found to have good digital perfusion well oriented x3     Assessment:  Flatfoot deformity with structural imbalance bilateral with history of orthotics which been beneficial agent     Plan:  The condition reviewed at great length and discussed the pros and cons of orthotics of a custom nature with him.  After review patient wants them they we went ahead and a cast of his feet today we will send this off for fabrication with instructions given to him today

## 2020-11-21 ENCOUNTER — Telehealth: Payer: Self-pay | Admitting: *Deleted

## 2020-11-21 ENCOUNTER — Other Ambulatory Visit: Payer: Self-pay

## 2020-11-21 ENCOUNTER — Other Ambulatory Visit (INDEPENDENT_AMBULATORY_CARE_PROVIDER_SITE_OTHER): Payer: Medicare HMO

## 2020-11-21 NOTE — Telephone Encounter (Signed)
PUO-11/21/20

## 2021-01-06 ENCOUNTER — Encounter: Payer: Self-pay | Admitting: *Deleted

## 2021-01-10 DIAGNOSIS — H0102A Squamous blepharitis right eye, upper and lower eyelids: Secondary | ICD-10-CM | POA: Diagnosis not present

## 2021-01-10 DIAGNOSIS — H0102B Squamous blepharitis left eye, upper and lower eyelids: Secondary | ICD-10-CM | POA: Diagnosis not present

## 2021-01-10 DIAGNOSIS — Z961 Presence of intraocular lens: Secondary | ICD-10-CM | POA: Diagnosis not present

## 2021-01-10 DIAGNOSIS — H43822 Vitreomacular adhesion, left eye: Secondary | ICD-10-CM | POA: Diagnosis not present

## 2021-01-10 DIAGNOSIS — H401133 Primary open-angle glaucoma, bilateral, severe stage: Secondary | ICD-10-CM | POA: Diagnosis not present

## 2021-01-18 DIAGNOSIS — Z23 Encounter for immunization: Secondary | ICD-10-CM | POA: Diagnosis not present

## 2021-04-10 DIAGNOSIS — R39198 Other difficulties with micturition: Secondary | ICD-10-CM | POA: Diagnosis not present

## 2021-04-10 DIAGNOSIS — E663 Overweight: Secondary | ICD-10-CM | POA: Diagnosis not present

## 2021-04-10 DIAGNOSIS — G4700 Insomnia, unspecified: Secondary | ICD-10-CM | POA: Diagnosis not present

## 2021-04-10 DIAGNOSIS — Z6827 Body mass index (BMI) 27.0-27.9, adult: Secondary | ICD-10-CM | POA: Diagnosis not present

## 2021-04-10 DIAGNOSIS — F329 Major depressive disorder, single episode, unspecified: Secondary | ICD-10-CM | POA: Diagnosis not present

## 2021-04-10 DIAGNOSIS — N529 Male erectile dysfunction, unspecified: Secondary | ICD-10-CM | POA: Diagnosis not present

## 2021-04-10 DIAGNOSIS — F419 Anxiety disorder, unspecified: Secondary | ICD-10-CM | POA: Diagnosis not present

## 2021-04-10 DIAGNOSIS — R69 Illness, unspecified: Secondary | ICD-10-CM | POA: Diagnosis not present

## 2021-04-18 ENCOUNTER — Ambulatory Visit: Payer: Self-pay

## 2021-04-18 NOTE — Telephone Encounter (Signed)
°  Chief Complaint: abdominal pain Symptoms: abdominal pain, diarrhea Frequency: 1-2 days Pertinent Negatives: Patient denies nausea or vomiting Disposition: [] ED /[] Urgent Care (no appt availability in office) / [] Appointment(In office/virtual)/ []  [place] [facility]/ [x] Home Care/ [] Refused Recommended Disposition /[] [facility]/ []  Follow-up with PCP Additional Notes: Pt has ate 3-4 cans of vienna sausages that were recalled and pt was wanting to make sure he didn't need to go get checked out    Reason for Disposition  [1] MILD-MODERATE pain AND [2] constant and [3] present < 2 hours  Answer Assessment - Initial Assessment Questions 1. LOCATION: "Where does it hurt?"      Middle of stomach  2. RADIATION: "Does the pain shoot anywhere else?" (e.g., chest, back)     No 3. ONSET: "When did the pain begin?" (Minutes, hours or days ago)      1-2 days 5. PATTERN "Does the pain come and go, or is it constant?"    - If constant: "Is it getting better, staying the same, or worsening?"      (Note: Constant means the pain never goes away completely; most serious pain is constant and it progresses)     - If intermittent: "How long does it last?" "Do you have pain now?"     (Note: Intermittent means the pain goes away completely between bouts)     Comes and goes  6. SEVERITY: "How bad is the pain?"  (e.g., Scale 1-10; mild, moderate, or severe)    - MILD (1-3): doesn't interfere with normal activities, abdomen soft and not tender to touch     - MODERATE (4-7): interferes with normal activities or awakens from sleep, abdomen tender to touch     - SEVERE (8-10): excruciating pain, doubled over, unable to do any normal activities       3-4 8. CAUSE: "What do you think is causing the stomach pain?"     Unsure if from eating sausages that were recalled  10. OTHER SYMPTOMS: "Do you have any other symptoms?" (e.g., back pain, diarrhea, fever, urination pain, vomiting)        diarrhea  Protocols used: Abdominal Pain - Male-A-AH

## 2021-05-04 DIAGNOSIS — H52223 Regular astigmatism, bilateral: Secondary | ICD-10-CM | POA: Diagnosis not present

## 2021-05-04 DIAGNOSIS — H524 Presbyopia: Secondary | ICD-10-CM | POA: Diagnosis not present

## 2021-05-10 DIAGNOSIS — H0102B Squamous blepharitis left eye, upper and lower eyelids: Secondary | ICD-10-CM | POA: Diagnosis not present

## 2021-05-10 DIAGNOSIS — Z961 Presence of intraocular lens: Secondary | ICD-10-CM | POA: Diagnosis not present

## 2021-05-10 DIAGNOSIS — H401133 Primary open-angle glaucoma, bilateral, severe stage: Secondary | ICD-10-CM | POA: Diagnosis not present

## 2021-05-10 DIAGNOSIS — H43822 Vitreomacular adhesion, left eye: Secondary | ICD-10-CM | POA: Diagnosis not present

## 2021-05-10 DIAGNOSIS — H0102A Squamous blepharitis right eye, upper and lower eyelids: Secondary | ICD-10-CM | POA: Diagnosis not present

## 2021-06-19 IMAGING — US US EXTREM LOW VENOUS*R*
1 series · 14 of 24 positions shown · non-contrast
Comparison: September 08, 2003.

CLINICAL DATA: Right knee pain.

EXAM:
Right LOWER EXTREMITY VENOUS DOPPLER ULTRASOUND
TECHNIQUE: Gray-scale sonography with compression, as well as color and duplex
ultrasound, were performed to evaluate the deep venous system(s)
from the level of the common femoral vein through the popliteal and
proximal calf veins.

[Series 1: us venous img lower uni right (dvt) · portal-venous · 14 of 40 slices shown]
[im 1/40]
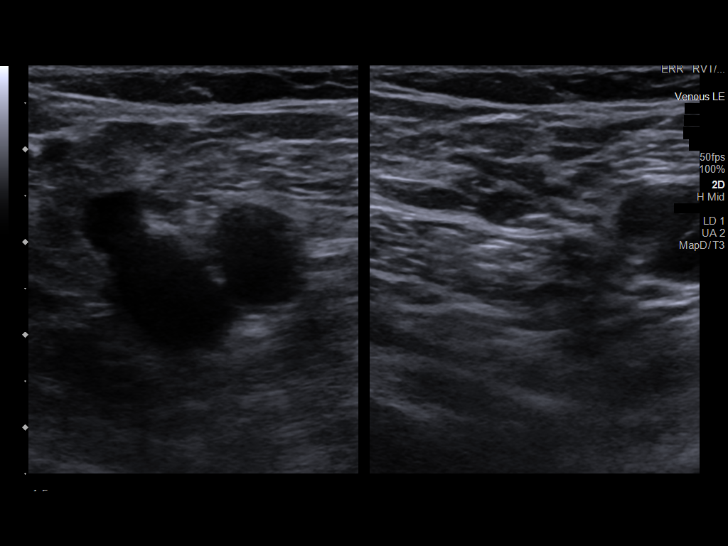
[im 4/40]
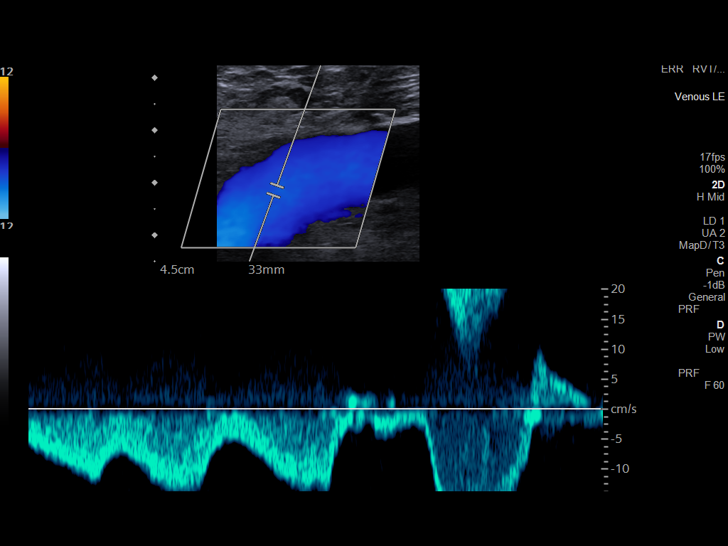
[im 7/40]
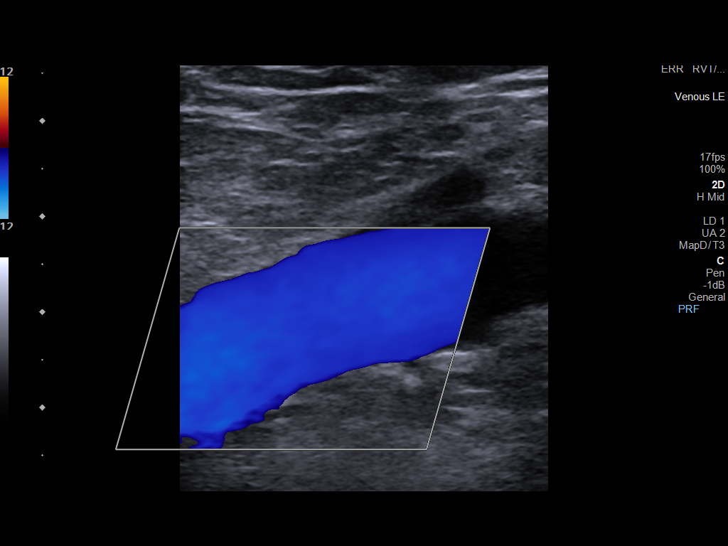
[im 11/40]
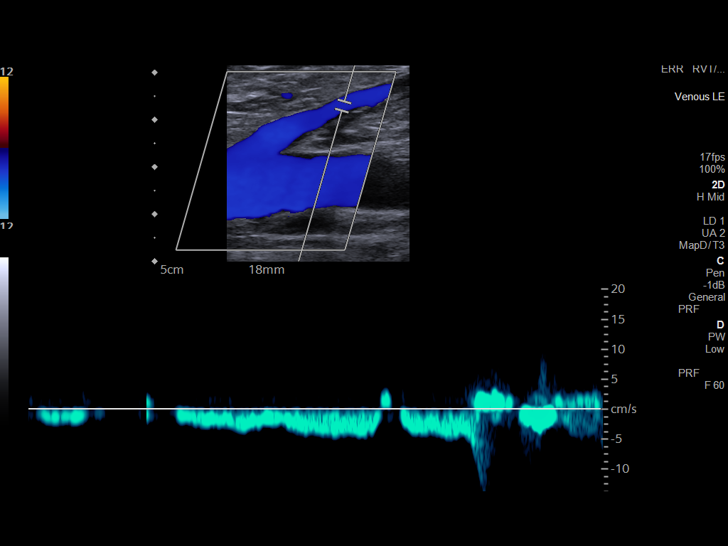
[im 12/40]
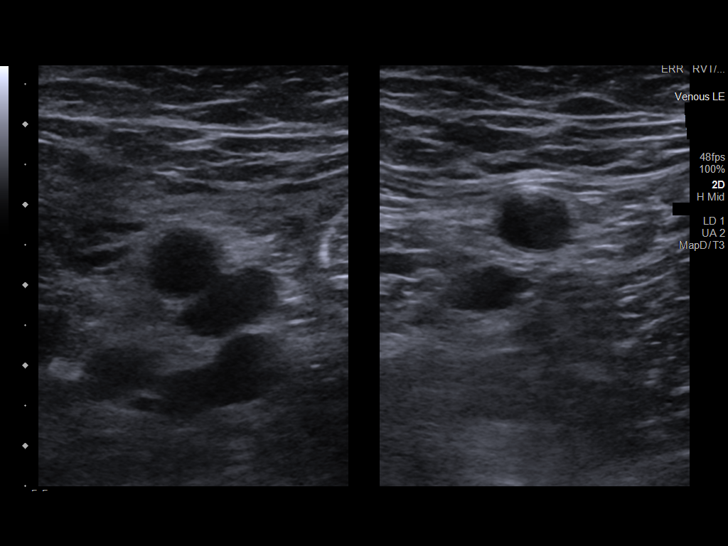
[im 16/40]
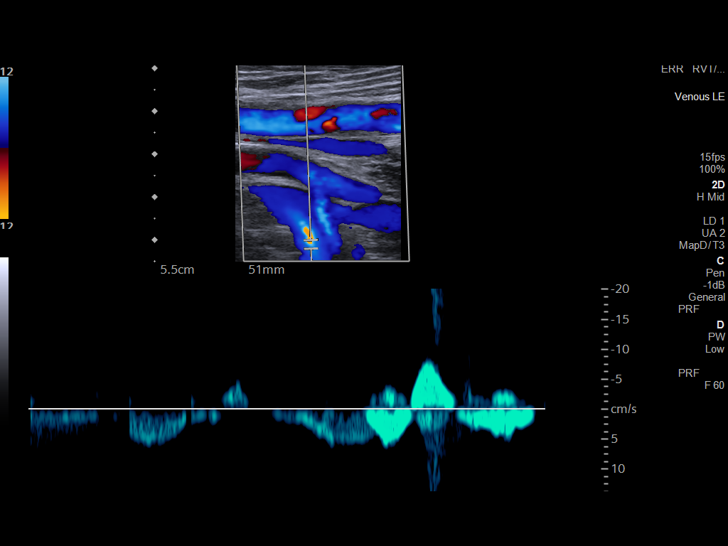
[im 19/40]
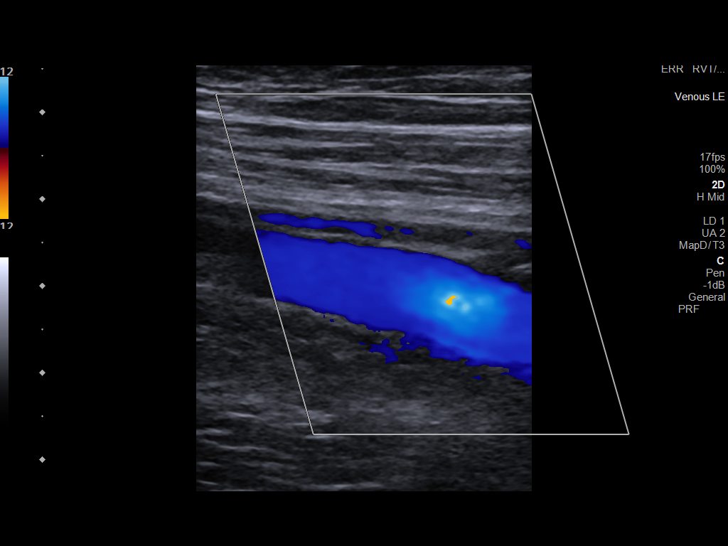
[im 21/40]
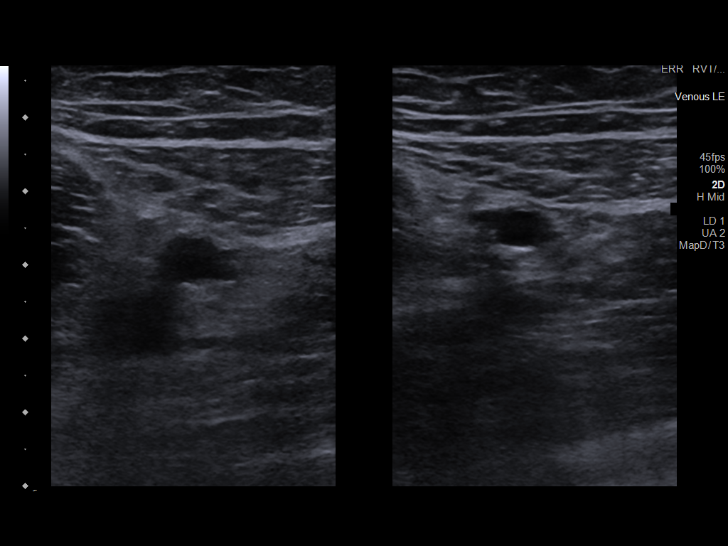
[im 24/40]
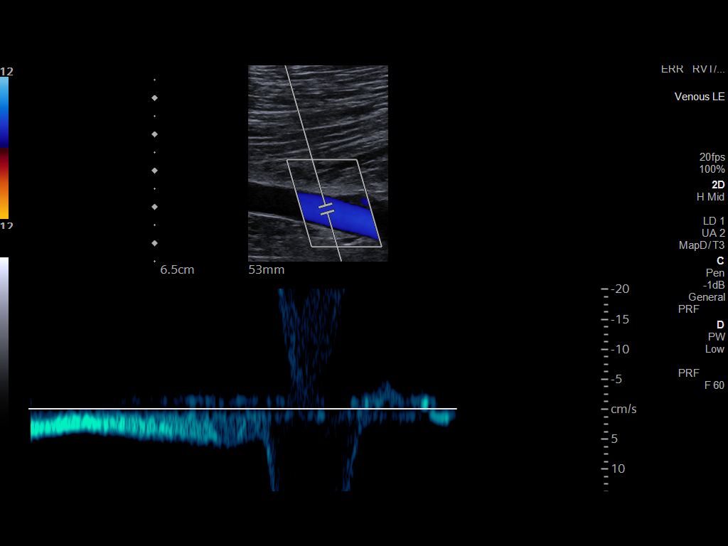
[im 28/40]
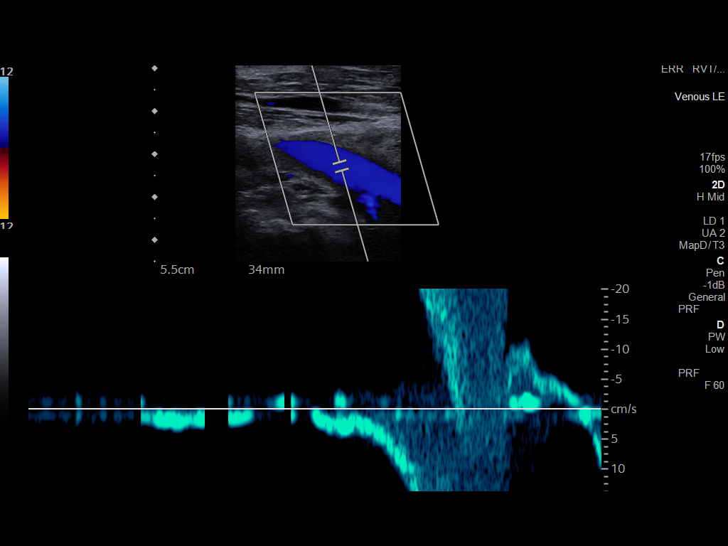
[im 31/40]
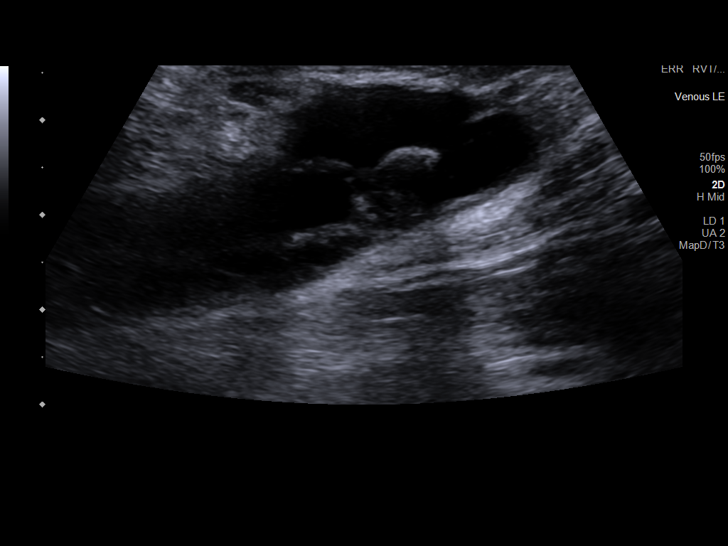
[im 33/40]
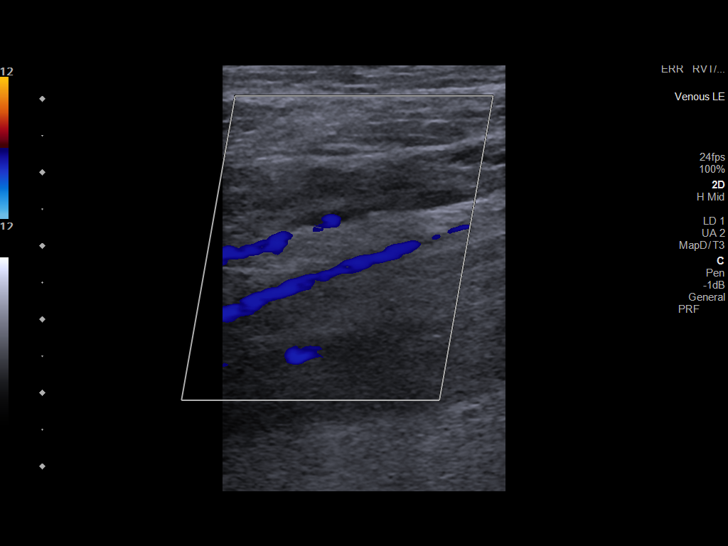
[im 36/40]
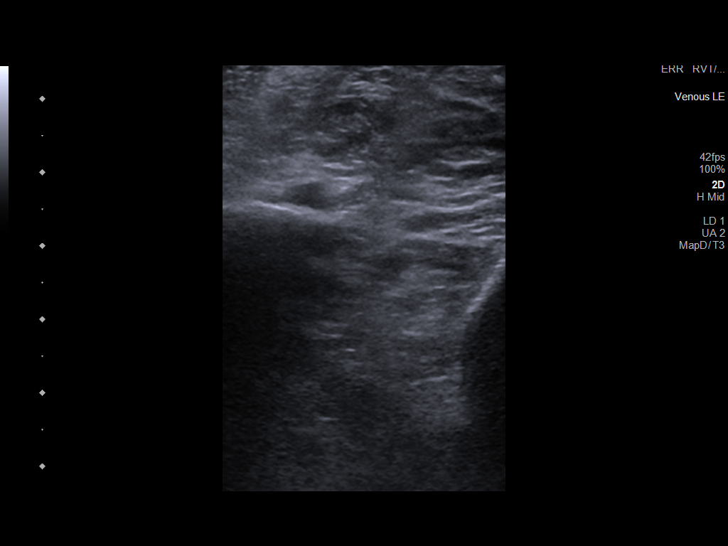
[im 40/40]
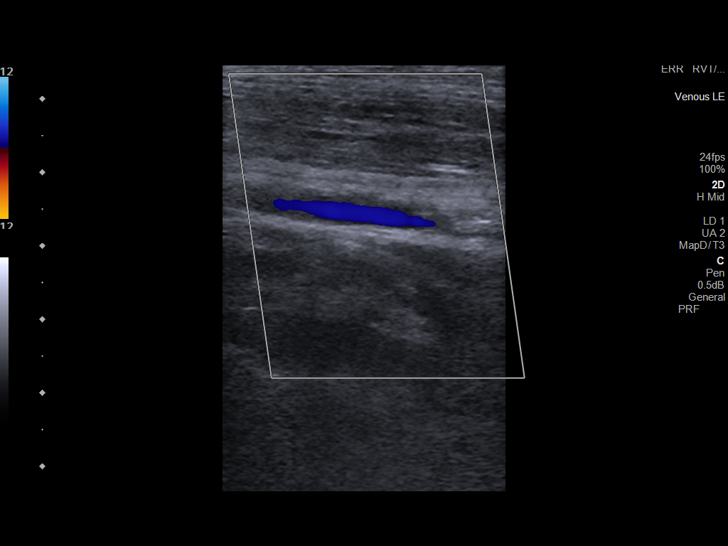

[14 of 24 positions shown; findings below may reference images not displayed]

FINDINGS: VENOUS

Normal compressibility of the common femoral, superficial femoral,
and popliteal veins, as well as the visualized calf veins.
Visualized portions of profunda femoral vein and great saphenous
vein unremarkable. No filling defects to suggest DVT on grayscale or
color Doppler imaging. Doppler waveforms show normal direction of
venous flow, normal respiratory plasticity and response to
augmentation.

Limited views of the contralateral common femoral vein are
unremarkable.

OTHER

Probable Baker's cyst seen in right popliteal fossa.

Limitations: none
IMPRESSION: No evidence of deep venous thrombosis seen in right lower extremity.
Baker's cyst seen in right popliteal fossa.

## 2021-07-14 DIAGNOSIS — Z6827 Body mass index (BMI) 27.0-27.9, adult: Secondary | ICD-10-CM | POA: Diagnosis not present

## 2021-07-14 DIAGNOSIS — L409 Psoriasis, unspecified: Secondary | ICD-10-CM | POA: Diagnosis not present

## 2021-07-14 DIAGNOSIS — E663 Overweight: Secondary | ICD-10-CM | POA: Diagnosis not present

## 2021-07-14 DIAGNOSIS — R69 Illness, unspecified: Secondary | ICD-10-CM | POA: Diagnosis not present

## 2021-07-27 DIAGNOSIS — R7309 Other abnormal glucose: Secondary | ICD-10-CM | POA: Diagnosis not present

## 2021-07-27 DIAGNOSIS — E538 Deficiency of other specified B group vitamins: Secondary | ICD-10-CM | POA: Diagnosis not present

## 2021-07-27 DIAGNOSIS — E119 Type 2 diabetes mellitus without complications: Secondary | ICD-10-CM | POA: Diagnosis not present

## 2021-07-27 DIAGNOSIS — E663 Overweight: Secondary | ICD-10-CM | POA: Diagnosis not present

## 2021-07-27 DIAGNOSIS — E559 Vitamin D deficiency, unspecified: Secondary | ICD-10-CM | POA: Diagnosis not present

## 2021-07-27 DIAGNOSIS — W57XXXA Bitten or stung by nonvenomous insect and other nonvenomous arthropods, initial encounter: Secondary | ICD-10-CM | POA: Diagnosis not present

## 2021-07-27 DIAGNOSIS — Z6827 Body mass index (BMI) 27.0-27.9, adult: Secondary | ICD-10-CM | POA: Diagnosis not present

## 2021-07-27 DIAGNOSIS — R5383 Other fatigue: Secondary | ICD-10-CM | POA: Diagnosis not present

## 2021-09-05 DIAGNOSIS — H0102A Squamous blepharitis right eye, upper and lower eyelids: Secondary | ICD-10-CM | POA: Diagnosis not present

## 2021-09-05 DIAGNOSIS — H43822 Vitreomacular adhesion, left eye: Secondary | ICD-10-CM | POA: Diagnosis not present

## 2021-09-05 DIAGNOSIS — H401133 Primary open-angle glaucoma, bilateral, severe stage: Secondary | ICD-10-CM | POA: Diagnosis not present

## 2021-09-05 DIAGNOSIS — Z9889 Other specified postprocedural states: Secondary | ICD-10-CM | POA: Diagnosis not present

## 2021-09-05 DIAGNOSIS — H0102B Squamous blepharitis left eye, upper and lower eyelids: Secondary | ICD-10-CM | POA: Diagnosis not present

## 2021-09-05 DIAGNOSIS — Z961 Presence of intraocular lens: Secondary | ICD-10-CM | POA: Diagnosis not present

## 2021-12-06 DIAGNOSIS — E063 Autoimmune thyroiditis: Secondary | ICD-10-CM | POA: Diagnosis not present

## 2021-12-06 DIAGNOSIS — E559 Vitamin D deficiency, unspecified: Secondary | ICD-10-CM | POA: Diagnosis not present

## 2021-12-06 DIAGNOSIS — R5383 Other fatigue: Secondary | ICD-10-CM | POA: Diagnosis not present

## 2021-12-06 DIAGNOSIS — E782 Mixed hyperlipidemia: Secondary | ICD-10-CM | POA: Diagnosis not present

## 2021-12-06 DIAGNOSIS — M722 Plantar fascial fibromatosis: Secondary | ICD-10-CM | POA: Diagnosis not present

## 2021-12-06 DIAGNOSIS — E663 Overweight: Secondary | ICD-10-CM | POA: Diagnosis not present

## 2021-12-06 DIAGNOSIS — E538 Deficiency of other specified B group vitamins: Secondary | ICD-10-CM | POA: Diagnosis not present

## 2021-12-06 DIAGNOSIS — Z125 Encounter for screening for malignant neoplasm of prostate: Secondary | ICD-10-CM | POA: Diagnosis not present

## 2021-12-06 DIAGNOSIS — Z6827 Body mass index (BMI) 27.0-27.9, adult: Secondary | ICD-10-CM | POA: Diagnosis not present

## 2021-12-06 DIAGNOSIS — R7309 Other abnormal glucose: Secondary | ICD-10-CM | POA: Diagnosis not present

## 2021-12-06 DIAGNOSIS — R39198 Other difficulties with micturition: Secondary | ICD-10-CM | POA: Diagnosis not present

## 2021-12-06 DIAGNOSIS — M1711 Unilateral primary osteoarthritis, right knee: Secondary | ICD-10-CM | POA: Diagnosis not present

## 2021-12-12 DIAGNOSIS — R945 Abnormal results of liver function studies: Secondary | ICD-10-CM | POA: Diagnosis not present

## 2021-12-12 DIAGNOSIS — R5383 Other fatigue: Secondary | ICD-10-CM | POA: Diagnosis not present

## 2021-12-12 DIAGNOSIS — E663 Overweight: Secondary | ICD-10-CM | POA: Diagnosis not present

## 2021-12-12 DIAGNOSIS — Z23 Encounter for immunization: Secondary | ICD-10-CM | POA: Diagnosis not present

## 2021-12-12 DIAGNOSIS — E063 Autoimmune thyroiditis: Secondary | ICD-10-CM | POA: Diagnosis not present

## 2021-12-12 DIAGNOSIS — E538 Deficiency of other specified B group vitamins: Secondary | ICD-10-CM | POA: Diagnosis not present

## 2021-12-12 DIAGNOSIS — Z6827 Body mass index (BMI) 27.0-27.9, adult: Secondary | ICD-10-CM | POA: Diagnosis not present

## 2021-12-12 DIAGNOSIS — M722 Plantar fascial fibromatosis: Secondary | ICD-10-CM | POA: Diagnosis not present

## 2021-12-12 DIAGNOSIS — Z1331 Encounter for screening for depression: Secondary | ICD-10-CM | POA: Diagnosis not present

## 2021-12-12 DIAGNOSIS — Z0001 Encounter for general adult medical examination with abnormal findings: Secondary | ICD-10-CM | POA: Diagnosis not present

## 2021-12-12 DIAGNOSIS — E119 Type 2 diabetes mellitus without complications: Secondary | ICD-10-CM | POA: Diagnosis not present

## 2021-12-25 ENCOUNTER — Ambulatory Visit: Payer: Medicare HMO | Admitting: Orthopedic Surgery

## 2022-01-16 DIAGNOSIS — R945 Abnormal results of liver function studies: Secondary | ICD-10-CM | POA: Diagnosis not present

## 2022-01-16 DIAGNOSIS — E039 Hypothyroidism, unspecified: Secondary | ICD-10-CM | POA: Diagnosis not present

## 2022-01-22 DIAGNOSIS — H0102B Squamous blepharitis left eye, upper and lower eyelids: Secondary | ICD-10-CM | POA: Diagnosis not present

## 2022-01-22 DIAGNOSIS — Z961 Presence of intraocular lens: Secondary | ICD-10-CM | POA: Diagnosis not present

## 2022-01-22 DIAGNOSIS — H43822 Vitreomacular adhesion, left eye: Secondary | ICD-10-CM | POA: Diagnosis not present

## 2022-01-22 DIAGNOSIS — H401133 Primary open-angle glaucoma, bilateral, severe stage: Secondary | ICD-10-CM | POA: Diagnosis not present

## 2022-01-22 DIAGNOSIS — H0102A Squamous blepharitis right eye, upper and lower eyelids: Secondary | ICD-10-CM | POA: Diagnosis not present

## 2022-02-14 ENCOUNTER — Other Ambulatory Visit (HOSPITAL_COMMUNITY): Payer: Self-pay | Admitting: Family Medicine

## 2022-02-14 DIAGNOSIS — E063 Autoimmune thyroiditis: Secondary | ICD-10-CM | POA: Diagnosis not present

## 2022-02-14 DIAGNOSIS — R945 Abnormal results of liver function studies: Secondary | ICD-10-CM

## 2022-02-14 DIAGNOSIS — E663 Overweight: Secondary | ICD-10-CM | POA: Diagnosis not present

## 2022-02-20 DIAGNOSIS — J111 Influenza due to unidentified influenza virus with other respiratory manifestations: Secondary | ICD-10-CM | POA: Diagnosis not present

## 2022-02-27 ENCOUNTER — Ambulatory Visit (HOSPITAL_COMMUNITY): Payer: Medicare HMO | Attending: Family Medicine

## 2022-02-27 ENCOUNTER — Encounter (HOSPITAL_COMMUNITY): Payer: Self-pay

## 2022-03-19 DIAGNOSIS — H905 Unspecified sensorineural hearing loss: Secondary | ICD-10-CM | POA: Diagnosis not present

## 2022-03-21 DIAGNOSIS — J40 Bronchitis, not specified as acute or chronic: Secondary | ICD-10-CM | POA: Diagnosis not present

## 2022-05-03 DIAGNOSIS — E063 Autoimmune thyroiditis: Secondary | ICD-10-CM | POA: Diagnosis not present

## 2022-05-21 DIAGNOSIS — H0102B Squamous blepharitis left eye, upper and lower eyelids: Secondary | ICD-10-CM | POA: Diagnosis not present

## 2022-05-21 DIAGNOSIS — Z961 Presence of intraocular lens: Secondary | ICD-10-CM | POA: Diagnosis not present

## 2022-05-21 DIAGNOSIS — H43822 Vitreomacular adhesion, left eye: Secondary | ICD-10-CM | POA: Diagnosis not present

## 2022-05-21 DIAGNOSIS — H401133 Primary open-angle glaucoma, bilateral, severe stage: Secondary | ICD-10-CM | POA: Diagnosis not present

## 2022-05-21 DIAGNOSIS — H0102A Squamous blepharitis right eye, upper and lower eyelids: Secondary | ICD-10-CM | POA: Diagnosis not present

## 2022-06-11 DIAGNOSIS — S51811A Laceration without foreign body of right forearm, initial encounter: Secondary | ICD-10-CM | POA: Diagnosis not present

## 2022-06-15 DIAGNOSIS — L259 Unspecified contact dermatitis, unspecified cause: Secondary | ICD-10-CM | POA: Diagnosis not present

## 2022-06-15 DIAGNOSIS — S51811D Laceration without foreign body of right forearm, subsequent encounter: Secondary | ICD-10-CM | POA: Diagnosis not present

## 2022-07-26 ENCOUNTER — Other Ambulatory Visit: Payer: Self-pay

## 2022-07-26 ENCOUNTER — Emergency Department (HOSPITAL_COMMUNITY): Payer: 59

## 2022-07-26 ENCOUNTER — Emergency Department (HOSPITAL_COMMUNITY)
Admission: EM | Admit: 2022-07-26 | Discharge: 2022-07-27 | Disposition: A | Discharge status: Home / Self Care | Payer: 59 | Attending: Emergency Medicine | Admitting: Emergency Medicine

## 2022-07-26 DIAGNOSIS — Y92513 Shop (commercial) as the place of occurrence of the external cause: Secondary | ICD-10-CM | POA: Insufficient documentation

## 2022-07-26 DIAGNOSIS — M25531 Pain in right wrist: Secondary | ICD-10-CM | POA: Diagnosis not present

## 2022-07-26 DIAGNOSIS — M545 Low back pain, unspecified: Secondary | ICD-10-CM | POA: Diagnosis not present

## 2022-07-26 DIAGNOSIS — W010XXA Fall on same level from slipping, tripping and stumbling without subsequent striking against object, initial encounter: Secondary | ICD-10-CM | POA: Insufficient documentation

## 2022-07-26 DIAGNOSIS — M542 Cervicalgia: Secondary | ICD-10-CM | POA: Insufficient documentation

## 2022-07-26 DIAGNOSIS — Z043 Encounter for examination and observation following other accident: Secondary | ICD-10-CM | POA: Diagnosis not present

## 2022-07-26 DIAGNOSIS — M79641 Pain in right hand: Secondary | ICD-10-CM | POA: Diagnosis not present

## 2022-07-26 DIAGNOSIS — M160 Bilateral primary osteoarthritis of hip: Secondary | ICD-10-CM | POA: Diagnosis not present

## 2022-07-26 DIAGNOSIS — M25551 Pain in right hip: Secondary | ICD-10-CM | POA: Insufficient documentation

## 2022-07-26 DIAGNOSIS — M19031 Primary osteoarthritis, right wrist: Secondary | ICD-10-CM | POA: Diagnosis not present

## 2022-07-26 DIAGNOSIS — S22080A Wedge compression fracture of T11-T12 vertebra, initial encounter for closed fracture: Secondary | ICD-10-CM | POA: Diagnosis not present

## 2022-07-26 DIAGNOSIS — W19XXXA Unspecified fall, initial encounter: Secondary | ICD-10-CM

## 2022-07-26 MED ORDER — ACETAMINOPHEN 325 MG PO TABS: 650.0000 mg | ORAL_TABLET | Freq: Once | ORAL | Status: DC

## 2022-07-26 NOTE — ED Triage Notes (Signed)
POV/ tripped over drop cord in shop/ pt c/o lower back pain, neck pain, right hip and leg pain/ pt is ambulatory/ denies hitting head, denies blood thinners

## 2022-07-26 NOTE — ED Provider Notes (Signed)
[place] EMERGENCY DEPARTMENT AT [facility] Provider Note   CSN: [id number] Arrival date & time: 07/26/22  2222     History  Chief Complaint  Patient presents with   Marletta Lor    John Melendez is a 77 y.o. male.   Fall   This patient is a 77 year old male presenting to the hospital after having an accidental fall which occurred at home when he was trying to walk through his garage carrying a heavy air conditioning unit and tripped over a drop cord.  He fell to the right side landing on his right hand and wrist as well as his right hip, he has pain across his lower back and around his pelvic bones.  He has no numbness or weakness in the legs, he was able to come in by himself with family member by private vehicle.  He has no head injury, he does have some mild neck pain but did not injure his head.  He has no chest pain or shortness of breath.  This occurred just prior to arrival.    Home Medications Prior to Admission medications   Medication Sig Start Date End Date Taking? Authorizing Provider  ALPRAZolam Prudy Feeler) 0.5 MG tablet Take 0.5 mg by mouth 3 (three) times daily.     [provider]  brimonidine (ALPHAGAN) 0.2 % ophthalmic solution Place 1 drop into both eyes 2 (two) times daily.     [provider]  calcium carbonate (TUMS EX) 750 MG chewable tablet Chew 1 tablet by mouth daily as needed for heartburn.     [provider]  Cholecalciferol (VITAMIN D3) 50 MCG (2000 UT) capsule Take 2,000 Units by mouth daily.    [provider]  citalopram (CELEXA) 40 MG tablet Take 20 mg by mouth daily.  03/28/19   [provider]  diclofenac Sodium (VOLTAREN) 1 % GEL Apply 2 g topically 4 (four) times daily as needed. 03/26/20   Long, Arlyss Repress, MD  HYDROcodone-acetaminophen (NORCO/VICODIN) 5-325 MG tablet Take 1 tablet by mouth every 12 (twelve) hours as needed. 08/30/19   Couture, Cortni S, PA-C  latanoprost (XALATAN) 0.005 % ophthalmic  solution Place 1 drop into both eyes at bedtime.     [provider]  levothyroxine (SYNTHROID) 88 MCG tablet Take 88 mcg by mouth daily before breakfast.     [provider]  lisinopril (PRINIVIL,ZESTRIL) 10 MG tablet Take 10 mg by mouth at bedtime.     [provider]  loratadine (CLARITIN) 10 MG tablet Take 10 mg by mouth daily.    [provider]  Multiple Vitamin (MULTIVITAMIN WITH MINERALS) TABS tablet Take 1 tablet by mouth daily.    [provider]  pantoprazole (PROTONIX) 40 MG tablet TAKE 1 TABLET ONCE A DAY 30 MINUTES BEFORE BREAKFAST. Patient taking differently: Take 40 mg by mouth every morning.  06/16/14   Tiffany Kocher, PA-C  QUERCETIN PO Take 1 tablet by mouth daily.    [provider]      Allergies    Penicillins    Review of Systems   Review of Systems  All other systems reviewed and are negative.   Physical Exam Updated Vital Signs BP (!) 163/83 (BP Location: Right Arm)   Pulse 65   Temp 98.3 F (36.8 C) (Oral)   Resp 18   Wt 91.2 kg   SpO2 97%   BMI 26.52 kg/m  Physical Exam Vitals and nursing note reviewed.  Constitutional:  General: He is not in acute distress.    Appearance: He is well-developed.  HENT:     Head: Normocephalic and atraumatic.     Mouth/Throat:     Pharynx: No oropharyngeal exudate.  Eyes:     General: No scleral icterus.       Right eye: No discharge.        Left eye: No discharge.     Conjunctiva/sclera: Conjunctivae normal.     Pupils: Pupils are equal, round, and reactive to light.  Neck:     Thyroid: No thyromegaly.     Vascular: No JVD.  Cardiovascular:     Rate and Rhythm: Normal rate and regular rhythm.     Heart sounds: Normal heart sounds. No murmur heard.    No friction rub. No gallop.  Pulmonary:     Effort: Pulmonary effort is normal. No respiratory distress.     Breath sounds: Normal breath sounds. No wheezing or rales.  Abdominal:     General: Bowel  sounds are normal. There is no distension.     Palpations: Abdomen is soft. There is no mass.     Tenderness: There is no abdominal tenderness.  Musculoskeletal:        General: Tenderness present.     Cervical back: Normal range of motion and neck supple.     Right lower leg: No edema.     Left lower leg: No edema.     Comments: Mild tenderness with range of motion of the right hip as well as the tenderness over the lower back and pelvic bones, he has tenderness over the right wrist, there is no obvious deformity of any of these areas and he is able to straight leg raise bilaterally as well as ambulate though with a mild antalgic gait.  Lymphadenopathy:     Cervical: No cervical adenopathy.  Skin:    General: Skin is warm and dry.     Findings: No erythema or rash.  Neurological:     General: No focal deficit present.     Mental Status: He is alert.     Sensory: No sensory deficit.     Motor: No weakness.     Coordination: Coordination normal.  Psychiatric:        Behavior: Behavior normal.     ED Results / Procedures / Treatments   Labs (all labs ordered are listed, but only abnormal results are displayed) Labs Reviewed - No data to display  EKG None  Radiology No results found.  Procedures Procedures    Medications Ordered in ED Medications - No data to display  ED Course/ Medical Decision Making/ A&P                             Medical Decision Making Amount and/or Complexity of Data Reviewed Radiology: ordered.  Risk OTC drugs. Prescription drug management.    This patient presents to the ED for concern of fall and trauma differential diagnosis includes possible wrist fracture, lumbar spine or sacral fractures, hip fractures pelvic fractures, cervical strain    Additional history obtained:  Additional history obtained from family member External records from outside source obtained and reviewed including prior office visits where he goes to see his  ophthalmologist and family doctor as well.  No recent admissions to the hospital going back several years.    Imaging Studies ordered:  I ordered imaging studies including x-rays of the pelvis, lumbar sacral  spine and right wrist I independently visualized and interpreted imaging which showed pending at the time of change of shift I agree with the radiologist interpretation   Medicines ordered and prescription drug management:  I ordered medication including Tylenol for pain  I have reviewed the patients home medicines and have made adjustments as needed   Problem List / ED Course:  Fall, x-rays pending at the time of change of shift   Social Determinants of Health:  None           Final Clinical Impression(s) / ED Diagnoses Final diagnoses:  Fall, initial encounter    Rx / DC Orders ED Discharge Orders     None         Eber Hong, MD 07/28/22 [phone]

## 2022-07-27 MED ORDER — LIDOCAINE 5 % EX PTCH: 1.0000 | MEDICATED_PATCH | CUTANEOUS | 0 refills | Status: AC

## 2022-07-27 MED ORDER — NAPROXEN 375 MG PO TABS: 375.0000 mg | ORAL_TABLET | Freq: Two times a day (BID) | ORAL | 0 refills | Status: AC

## 2022-07-27 MED ORDER — ACETAMINOPHEN 500 MG PO TABS: 1000.0000 mg | ORAL_TABLET | Freq: Once | ORAL | Status: DC

## 2022-07-27 MED ORDER — LIDOCAINE 5 % EX PTCH
1.0000 | MEDICATED_PATCH | CUTANEOUS | Status: DC
  Administered 2022-07-27: 1 via TRANSDERMAL
  Filled 2022-07-27: qty 1

## 2022-07-27 MED ORDER — KETOROLAC TROMETHAMINE 15 MG/ML IJ SOLN
15.0000 mg | Freq: Once | INTRAMUSCULAR | Status: AC
  Administered 2022-07-27: 15 mg via INTRAMUSCULAR
  Filled 2022-07-27: qty 1

## 2022-07-27 MED ORDER — ACETAMINOPHEN 500 MG PO TABS: 1000.0000 mg | ORAL_TABLET | Freq: Three times a day (TID) | ORAL | 0 refills | Status: AC

## 2022-07-27 NOTE — Discharge Instructions (Addendum)
For pain:  - Acetaminophen 1000 mg three times daily (every 8 hours) - Naproxen 2 times daily (every 12 hours) - lidoderm patches twice daily 

## 2022-07-27 NOTE — ED Provider Notes (Signed)
  Physical Exam  BP (!) 163/83 (BP Location: Right Arm)   Pulse 65   Temp 98.3 F (36.8 C) (Oral)   Resp 18   Wt 91.2 kg   SpO2 97%   BMI 26.52 kg/m   Physical Exam Constitutional:      General: He is not in acute distress.    Appearance: Normal appearance.  HENT:     Head: Normocephalic and atraumatic.     Nose: No congestion or rhinorrhea.  Eyes:     General:        Right eye: No discharge.        Left eye: No discharge.     Extraocular Movements: Extraocular movements intact.     Pupils: Pupils are equal, round, and reactive to light.  Cardiovascular:     Rate and Rhythm: Normal rate and regular rhythm.     Heart sounds: No murmur heard. Pulmonary:     Effort: No respiratory distress.     Breath sounds: No wheezing or rales.  Abdominal:     General: There is no distension.     Tenderness: There is no abdominal tenderness.  Musculoskeletal:        General: Normal range of motion.     Cervical back: Normal range of motion.  Skin:    General: Skin is warm and dry.  Neurological:     General: No focal deficit present.     Mental Status: He is alert.     Procedures  Procedures  ED Course / MDM    Medical Decision Making Amount and/or Complexity of Data Reviewed Radiology: ordered.  Risk OTC drugs.   Patient received in handoff.  Mechanical fall with no head trauma, head strike or loss of consciousness.  Pending x-ray imaging.  Plan for discharge after x-ray imaging if negative.  X-ray imaging reassuring with no evidence of fracture.  Patient then discharged with outpatient follow-up.       Glendora Score, MD 07/27/22 0002

## 2022-07-30 DIAGNOSIS — E039 Hypothyroidism, unspecified: Secondary | ICD-10-CM | POA: Diagnosis not present

## 2022-08-03 ENCOUNTER — Telehealth: Payer: Self-pay

## 2022-08-03 NOTE — Telephone Encounter (Signed)
Transition Care Management Follow-up Telephone Call Date of discharge and from where: 07/27/2022 [facility] How have you been since you were released from the hospital? Patient is still in a lot of pain. Any questions or concerns? No  Items Reviewed: Did the pt receive and understand the discharge instructions provided? Yes  Medications obtained and verified? Yes  Other? No  Any new allergies since your discharge? No  Dietary orders reviewed? Yes Do you have support at home? Yes   Follow up appointments reviewed:  PCP Hospital f/u appt confirmed?  Patient stated he contacted the office but was unable to get an appointment until late June. He will call again.   Scheduled to see  on  @ . Specialist Hospital f/u appt confirmed? No  Scheduled to see  on  @ . Are transportation arrangements needed? No  If their condition worsens, is the pt aware to call PCP or go to the Emergency Dept.? Yes Was the patient provided with contact information for the PCP's office or ED? Yes Was to pt encouraged to call back with questions or concerns? Yes  Draycen Leichter [facility]  [facility] Population Health Community Resource Care Guide   [email or web address]  ?? [phone]   Website: [email or web address]  [email or web address]

## 2022-08-03 NOTE — Telephone Encounter (Signed)
Transition Care Management Unsuccessful Follow-up Telephone Call  Date of discharge and from where:  07/27/2022 [facility]  Attempts:  1st Attempt  Reason for unsuccessful TCM follow-up call:  Unable to reach patient  Jleigh Striplin [facility]  [facility] Population Health Community Resource Care Guide   [email or web address]  ?? [phone]   Website: [email or web address]  [email or web address]

## 2022-08-08 DIAGNOSIS — E039 Hypothyroidism, unspecified: Secondary | ICD-10-CM | POA: Diagnosis not present

## 2022-08-08 DIAGNOSIS — E559 Vitamin D deficiency, unspecified: Secondary | ICD-10-CM | POA: Diagnosis not present

## 2022-08-08 DIAGNOSIS — M545 Low back pain, unspecified: Secondary | ICD-10-CM | POA: Diagnosis not present

## 2022-08-08 DIAGNOSIS — R296 Repeated falls: Secondary | ICD-10-CM | POA: Diagnosis not present

## 2022-08-08 DIAGNOSIS — R39198 Other difficulties with micturition: Secondary | ICD-10-CM | POA: Diagnosis not present

## 2022-08-08 DIAGNOSIS — L409 Psoriasis, unspecified: Secondary | ICD-10-CM | POA: Diagnosis not present

## 2022-08-08 DIAGNOSIS — R7309 Other abnormal glucose: Secondary | ICD-10-CM | POA: Diagnosis not present

## 2022-08-08 DIAGNOSIS — E538 Deficiency of other specified B group vitamins: Secondary | ICD-10-CM | POA: Diagnosis not present

## 2022-08-08 DIAGNOSIS — R945 Abnormal results of liver function studies: Secondary | ICD-10-CM | POA: Diagnosis not present

## 2022-10-01 DIAGNOSIS — H401133 Primary open-angle glaucoma, bilateral, severe stage: Secondary | ICD-10-CM | POA: Diagnosis not present

## 2022-10-30 DIAGNOSIS — B351 Tinea unguium: Secondary | ICD-10-CM | POA: Diagnosis not present

## 2022-10-30 DIAGNOSIS — E039 Hypothyroidism, unspecified: Secondary | ICD-10-CM | POA: Diagnosis not present

## 2023-01-24 DIAGNOSIS — Z23 Encounter for immunization: Secondary | ICD-10-CM | POA: Diagnosis not present

## 2023-01-30 DIAGNOSIS — H0102A Squamous blepharitis right eye, upper and lower eyelids: Secondary | ICD-10-CM | POA: Diagnosis not present

## 2023-01-30 DIAGNOSIS — H0102B Squamous blepharitis left eye, upper and lower eyelids: Secondary | ICD-10-CM | POA: Diagnosis not present

## 2023-01-30 DIAGNOSIS — H43822 Vitreomacular adhesion, left eye: Secondary | ICD-10-CM | POA: Diagnosis not present

## 2023-01-30 DIAGNOSIS — Z961 Presence of intraocular lens: Secondary | ICD-10-CM | POA: Diagnosis not present

## 2023-01-30 DIAGNOSIS — H401133 Primary open-angle glaucoma, bilateral, severe stage: Secondary | ICD-10-CM | POA: Diagnosis not present

## 2023-02-12 DIAGNOSIS — Z0001 Encounter for general adult medical examination with abnormal findings: Secondary | ICD-10-CM | POA: Diagnosis not present

## 2023-02-12 DIAGNOSIS — R945 Abnormal results of liver function studies: Secondary | ICD-10-CM | POA: Diagnosis not present

## 2023-02-12 DIAGNOSIS — E039 Hypothyroidism, unspecified: Secondary | ICD-10-CM | POA: Diagnosis not present

## 2023-02-12 DIAGNOSIS — E063 Autoimmune thyroiditis: Secondary | ICD-10-CM | POA: Diagnosis not present

## 2023-02-12 DIAGNOSIS — J40 Bronchitis, not specified as acute or chronic: Secondary | ICD-10-CM | POA: Diagnosis not present

## 2023-02-12 DIAGNOSIS — M722 Plantar fascial fibromatosis: Secondary | ICD-10-CM | POA: Diagnosis not present

## 2023-02-21 NOTE — Progress Notes (Signed)
Referring Provider:Golding, Jonny Ruiz, MD Primary Care Physician:  Assunta Found, MD Primary Gastroenterologist:  Dr. Jena Gauss  Chief Complaint  Patient presents with   Abdominal Pain    Lower abdominal pain. Stomach pains in the middle of the night.     HPI:   John Melendez is a 77 y.o. male presenting today at the request of Assunta Found, MD for abdominal pain.   Intermittent mid lower/periumbilical abdominal pain for the last few years. If something wakes him up at night, it starts hurting. Sometimes when waking up in the morning, he may have it, once he gets up and gets to moving around, he feels better. No association with meals or bowel movements.   Pain is described as dull and occurs about 4 times a week.   Can toss and turn for an hour or more at night when it occurs. .   Cold seems to provide some relief.    Bowels moving well. No constipation or diarrhea. No brbpr or melena. No nausea or vomiting. Has a good appetite. No unintentional weight loss. No heartburn. Reports seeing GI in [place] a few years ago for the same and they couldn't figure anything out. Tried him on various medications and nothing improved.   Per chart review, looks like patient has been referred to similar symptoms dating back at least to 2015. Work-up included EGD, colonoscopy and CT scan.  EGD demonstrated noncritical Schatzki's ring and a small hiatal hernia.  Dr. Jena Gauss felt there were some mild extrinsic compression on EGD. Colonoscopy demonstrated small small adenoma and  diverticulosis - recommended 7 year surveillance. CT demonstrated only hiatal hernia and a nonobstructing right renal calculus. Nothing to explain extrinsic compression. He saw Dr. Jason Fila in [place] in 2016 and 2017.  I am unable to review records, but per Dr. Luvenia Starch last note, patient had been tried on antidepressants and anti-spasmodics without any improvement. Dr. Jena Gauss had recommended patient see Dr. Lovell Sheehan for further  evaluation. He did see Dr. Lovell Sheehan in 2019 and underwent periumbilical incisional hernia repair.    Past Medical History:  Diagnosis Date   Anxiety    Arthritis    Depression    GERD (gastroesophageal reflux disease)    no PPI currently, symptoms controlled with diet and behavior   Glaucoma    History of kidney stones    Hypertension    Hypothyroidism    Sleep apnea     Past Surgical History:  Procedure Laterality Date   BACK SURGERY     ruptured lumbar disc.   CHOLECYSTECTOMY     COLONOSCOPY  02/15/14   WUJ:WJXB papilla internal hemorrhoids/colonic diverticulosis/multiple colonic polyp   ESOPHAGOGASTRODUODENOSCOPY  2004   Dr. Jena Gauss: non-critical Schatzki's ring s/p dilation with 3 F, small hiatal hernia, normal D1 and D2    ESOPHAGOGASTRODUODENOSCOPY  02/15/14   [id number] schatzki's ring not manipulated/hiatal hernia   INCISIONAL HERNIA REPAIR N/A 06/10/2017   Procedure: HERNIA REPAIR INCISIONAL WITH MESH;  Surgeon: Franky Macho, MD;  Location: [facility];  Service: General;  Laterality: N/A;   SLT LASER APPLICATION Right 06/13/2015   Procedure: SLT LASER APPLICATION;  Surgeon: Susa Simmonds, MD;  Location: [facility];  Service: Ophthalmology;  Laterality: Right;    Current Outpatient Medications  Medication Sig Dispense Refill   ALPRAZolam (XANAX) 0.5 MG tablet Take 0.5 mg by mouth 3 (three) times daily.      brimonidine (ALPHAGAN) 0.2 % ophthalmic solution Place 1 drop into both eyes 2 (two) times daily.  calcium carbonate (TUMS EX) 750 MG chewable tablet Chew 1 tablet by mouth daily as needed for heartburn.      Cholecalciferol (VITAMIN D3) 50 MCG (2000 UT) capsule Take 2,000 Units by mouth daily.     citalopram (CELEXA) 40 MG tablet Take 20 mg by mouth daily.      latanoprost (XALATAN) 0.005 % ophthalmic solution Place 1 drop into both eyes at bedtime.      levothyroxine (SYNTHROID) 88 MCG tablet Take 88 mcg by mouth daily before breakfast.      lisinopril  (PRINIVIL,ZESTRIL) 10 MG tablet Take 10 mg by mouth at bedtime.      loratadine (CLARITIN) 10 MG tablet Take 10 mg by mouth daily.     Multiple Vitamin (MULTIVITAMIN WITH MINERALS) TABS tablet Take 1 tablet by mouth daily.     omeprazole (PRILOSEC) 40 MG capsule Take 40 mg by mouth daily.     QUERCETIN PO Take 1 tablet by mouth daily.     diclofenac Sodium (VOLTAREN) 1 % GEL Apply 2 g topically 4 (four) times daily as needed. (Patient not taking: Reported on 02/22/2023) 50 g 0   HYDROcodone-acetaminophen (NORCO/VICODIN) 5-325 MG tablet Take 1 tablet by mouth every 12 (twelve) hours as needed. (Patient not taking: Reported on 02/22/2023) 6 tablet 0   lidocaine (LIDODERM) 5 % Place 1 patch onto the skin daily. Remove & Discard patch within 12 hours or as directed by MD (Patient not taking: Reported on 02/22/2023) 30 patch 0   LUTEIN PO Take by mouth.     naproxen (NAPROSYN) 375 MG tablet Take 1 tablet (375 mg total) by mouth 2 (two) times daily. 20 tablet 0   No current facility-administered medications for this visit.    Allergies as of 02/22/2023 - Review Complete 02/22/2023  Allergen Reaction Noted   Penicillins Nausea Only 04/23/2011    Family History  Problem Relation Age of Onset   Colon cancer Neg Hx     Social History   Socioeconomic History   Marital status: Divorced    Spouse name: Not on file   Number of children: 7   Years of education: Not on file   Highest education level: Not on file  Occupational History   Not on file  Tobacco Use   Smoking status: Former    Current packs/day: 0.00    Average packs/day: 3.0 packs/day for 34.0 years (102.0 ttl pk-yrs)    Types: Cigarettes    Start date: 06/07/1955    Quit date: 06/06/1989    Years since quitting: 33.7   Smokeless tobacco: Never  Vaping Use   Vaping status: Never Used  Substance and Sexual Activity   Alcohol use: No   Drug use: No   Sexual activity: Yes    Birth control/protection: None  Other Topics  Concern   Not on file  Social History Narrative   Not on file   Social Drivers of Health   Financial Resource Strain: Not on file  Food Insecurity: Not on file  Transportation Needs: Not on file  Physical Activity: Not on file  Stress: Not on file  Social Connections: Not on file  Intimate Partner Violence: Not on file    Review of Systems: Gen: Denies any fever, chills, cold or flulike symptoms, pre-syncope, syncope. CV: Denies chest pain, heart palpitations. Resp: Denies shortness of breath, cough.  GI: See HPI GU : Denies urinary burning, urinary frequency, urinary hesitancy.  MS: Denies joint pain. Derm: Denies rash. Psych: Denies  depression, anxiety. Heme: See HPI  Physical Exam: BP 138/74 (BP Location: Right Arm, Patient Position: Sitting, Cuff Size: Large)   Pulse 63   Temp 97.9 F (36.6 C) (Temporal)   Ht 6' (1.829 m)   Wt 208 lb (94.3 kg)   BMI 28.21 kg/m  General:   Alert and oriented. Pleasant and cooperative. Well-nourished and well-developed.  Head:  Normocephalic and atraumatic. Eyes:  Without icterus, sclera clear and conjunctiva pink.  Ears:  Normal auditory acuity. Lungs:  Clear to auscultation bilaterally. No wheezes, rales, or rhonchi. No distress.  Heart:  S1, S2 present without murmurs appreciated.  Abdomen:  +BS, soft, and non-distended. Minimal TTP in LLQ, suprapubic, and periumbilical area. No HSM noted. No guarding or rebound. No masses appreciated.  Rectal:  Deferred  Msk:  Symmetrical without gross deformities. Normal posture. Extremities:  Without edema. Neurologic:  Alert and  oriented x4;  grossly normal neurologically. Skin:  Intact without significant lesions or rashes. Psych:   Normal mood and affect.    Assessment:  77 year old male with history of anxiety, depression, GERD, glaucoma, HTN, hypothyroidism, sleep apnea, adenomatous colon polyp in 2015, overdue for surveillance colonoscopy, Schatzki's ring, presenting today for  further evaluation of intermittent periumbilical/lower abdominal pain dating back to 2015, only occurring in the middle of the night or present when he wakes up first thing in the morning, but resolves once he gets up out of bed.  Denies any association with meals or bowel movements.  Bowels are moving well without BRBPR or melena.  No unintentional weight loss.  Denies chronic back pain.  On exam, he does have very mild reproducible abdominal tenderness in the left lower quadrant, suprapubic region, and periumbilically.  Denies urinary symptoms.    Prior evaluation for similar symptoms in 2015 with EGD, colonoscopy, CT as detailed above, all unrevealing.  Apparently has been tried on antidepressants and antispasmodics without improvement.  Saw Dr. Lovell Sheehan and underwent periumbilical incisional hernia repair in 2019, but symptoms continue. Hard to tell if symptoms have changed much over the years as this is my first time seeing him, but per patient's report, there is no significant change. .  Etiology is unclear.  As he is overdue for colonoscopy, recommend that we start with updating his colonoscopy.  If this is unrevealing, recommend proceeding with CT scan as he hasn't had one in a few years.    Plan:  Proceed with colonoscopy with propofol by Dr. Jena Gauss in near future. The risks, benefits, and alternatives have been discussed with the patient in detail. The patient states understanding and desires to proceed.  ASA 3 Small volume prep as patient refused to drink trilyte again.  If colonoscopy unrevealing in regards to pain, recommend CT A/P with contrast.  Follow-up after colonoscopy.    Ermalinda Memos, PA-C [facility]enter[facility] 02/22/2023

## 2023-02-22 ENCOUNTER — Encounter: Payer: Self-pay | Admitting: Gastroenterology

## 2023-02-22 ENCOUNTER — Ambulatory Visit (INDEPENDENT_AMBULATORY_CARE_PROVIDER_SITE_OTHER): Payer: 59 | Admitting: Gastroenterology

## 2023-02-22 VITALS — BP 138/74 | HR 63 | Temp 97.9°F | Ht 72.0 in | Wt 208.0 lb

## 2023-02-22 DIAGNOSIS — Z860100 Personal history of colon polyps, unspecified: Secondary | ICD-10-CM

## 2023-02-22 DIAGNOSIS — Z860101 Personal history of adenomatous and serrated colon polyps: Secondary | ICD-10-CM

## 2023-02-22 DIAGNOSIS — R1033 Periumbilical pain: Secondary | ICD-10-CM

## 2023-02-22 DIAGNOSIS — R1032 Left lower quadrant pain: Secondary | ICD-10-CM | POA: Diagnosis not present

## 2023-02-22 DIAGNOSIS — R1030 Lower abdominal pain, unspecified: Secondary | ICD-10-CM | POA: Insufficient documentation

## 2023-02-22 NOTE — Patient Instructions (Addendum)
We will get you scheduled for colonoscopy in the near future with Dr. Jena Gauss.  I will see back in the office after colonoscopy.  Ermalinda Memos, PA-C [facility] Gastroenterology

## 2023-02-25 ENCOUNTER — Telehealth: Payer: Self-pay | Admitting: *Deleted

## 2023-02-25 NOTE — Telephone Encounter (Signed)
LMOVM to call back to schedule TCS with Dr. Jena Gauss, ASA 3, low volume prep

## 2023-02-28 NOTE — Telephone Encounter (Signed)
LMOVM TO CALL BACK. Letter mailed

## 2023-04-01 ENCOUNTER — Ambulatory Visit (INDEPENDENT_AMBULATORY_CARE_PROVIDER_SITE_OTHER): Payer: 59 | Admitting: Urology

## 2023-04-01 VITALS — BP 158/89 | HR 77

## 2023-04-01 DIAGNOSIS — N200 Calculus of kidney: Secondary | ICD-10-CM | POA: Diagnosis not present

## 2023-04-01 DIAGNOSIS — R972 Elevated prostate specific antigen [PSA]: Secondary | ICD-10-CM | POA: Diagnosis not present

## 2023-04-01 NOTE — Progress Notes (Addendum)
04/01/2023 3:18 PM   John Melendez 02-13-1946 [id number]  Referring provider: Assunta Found, MD [address] [place],  [place] [address]  No chief complaint on file.   HPI:  New pt -   1) PSA elevation - pt with a Dec 2024 PSA of 4.7. No biopsy. No FH PCa.    2) BPH - minimal BPH on CT in 2021. Pt given Rx for tamsulosin but hasn't started it.   3) urolithiasis - h/o ESWL and URS with Dr. Jerre Simon in 2009. CT 2021 with 3 mm right stone.   Today, seen for the above.   No OAC. Retired from [employer].    PMH: Past Medical History:  Diagnosis Date   Anxiety    Arthritis    Depression    GERD (gastroesophageal reflux disease)    no PPI currently, symptoms controlled with diet and behavior   Glaucoma    History of kidney stones    Hypertension    Hypothyroidism    Sleep apnea     Surgical History: Past Surgical History:  Procedure Laterality Date   BACK SURGERY     ruptured lumbar disc.   CHOLECYSTECTOMY     COLONOSCOPY  02/15/14   UVO:ZDGU papilla internal hemorrhoids/colonic diverticulosis/multiple colonic polyp   ESOPHAGOGASTRODUODENOSCOPY  2004   Dr. Jena Gauss: non-critical Schatzki's ring s/p dilation with 31 F, small hiatal hernia, normal D1 and D2    ESOPHAGOGASTRODUODENOSCOPY  02/15/14   [id number]:[id number] schatzki's ring not manipulated/hiatal hernia   INCISIONAL HERNIA REPAIR N/A 06/10/2017   Procedure: HERNIA REPAIR INCISIONAL WITH MESH;  Surgeon: Franky Macho, MD;  Location: [facility];  Service: General;  Laterality: N/A;   SLT LASER APPLICATION Right 06/13/2015   Procedure: SLT LASER APPLICATION;  Surgeon: Susa Simmonds, MD;  Location: [facility];  Service: Ophthalmology;  Laterality: Right;    Home Medications:  Allergies as of 04/01/2023       Reactions   Penicillins Nausea Only        Medication List        Accurate as of April 01, 2023  3:18 PM. If you have any questions, ask your nurse or doctor.          ALPRAZolam 0.5 MG  tablet Commonly known as: XANAX Take 0.5 mg by mouth 3 (three) times daily.   brimonidine 0.2 % ophthalmic solution Commonly known as: ALPHAGAN Place 1 drop into both eyes 2 (two) times daily.   calcium carbonate 750 MG chewable tablet Commonly known as: TUMS EX Chew 1 tablet by mouth daily as needed for heartburn.   citalopram 40 MG tablet Commonly known as: CELEXA Take 20 mg by mouth daily.   diclofenac Sodium 1 % Gel Commonly known as: Voltaren Apply 2 g topically 4 (four) times daily as needed.   HYDROcodone-acetaminophen 5-325 MG tablet Commonly known as: NORCO/VICODIN Take 1 tablet by mouth every 12 (twelve) hours as needed.   latanoprost 0.005 % ophthalmic solution Commonly known as: XALATAN Place 1 drop into both eyes at bedtime.   levothyroxine 88 MCG tablet Commonly known as: SYNTHROID Take 88 mcg by mouth daily before breakfast.   lidocaine 5 % Commonly known as: Lidoderm Place 1 patch onto the skin daily. Remove & Discard patch within 12 hours or as directed by MD   lisinopril 10 MG tablet Commonly known as: ZESTRIL Take 10 mg by mouth at bedtime.   loratadine 10 MG tablet Commonly known as: CLARITIN Take 10 mg by mouth daily.  LUTEIN PO Take by mouth.   multivitamin with minerals Tabs tablet Take 1 tablet by mouth daily.   naproxen 375 MG tablet Commonly known as: NAPROSYN Take 1 tablet (375 mg total) by mouth 2 (two) times daily.   omeprazole 40 MG capsule Commonly known as: PRILOSEC Take 40 mg by mouth daily.   QUERCETIN PO Take 1 tablet by mouth daily.   Vitamin D3 50 MCG (2000 UT) capsule Take 2,000 Units by mouth daily.        Allergies:  Allergies  Allergen Reactions   Penicillins Nausea Only    Family History: Family History  Problem Relation Age of Onset   Colon cancer Neg Hx     Social History:  reports that he quit smoking about 33 years ago. His smoking use included cigarettes. He started smoking about 67 years  ago. He has a 102 pack-year smoking history. He has never used smokeless tobacco. He reports that he does not drink alcohol and does not use drugs.   Physical Exam: There were no vitals taken for this visit.  Constitutional:  Alert and oriented, No acute distress. HEENT: [place] AT, moist mucus membranes.  Trachea midline, no masses. Cardiovascular: No clubbing, cyanosis, or edema. Respiratory: Normal respiratory effort, no increased work of breathing. GI: Abdomen is soft, nontender, nondistended, no abdominal masses GU: No CVA tenderness Skin: No rashes, bruises or suspicious lesions. Neurologic: Grossly intact, no focal deficits, moving all 4 extremities. Psychiatric: Normal mood and affect.  Laboratory Data: Lab Results  Component Value Date   WBC 6.8 08/30/2019   HGB 15.2 08/30/2019   HCT 45.5 08/30/2019   MCV 96.6 08/30/2019   PLT 208 08/30/2019    Lab Results  Component Value Date   CREATININE 1.00 08/30/2019    No results found for: "PSA"  No results found for: "TESTOSTERONE"  No results found for: "HGBA1C"  Urinalysis    Component Value Date/Time   COLORURINE STRAW (A) 08/30/2019 1909   APPEARANCEUR CLEAR 08/30/2019 1909   LABSPEC 1.005 08/30/2019 1909   PHURINE 7.0 08/30/2019 1909   GLUCOSEU NEGATIVE 08/30/2019 1909   HGBUR NEGATIVE 08/30/2019 1909   BILIRUBINUR NEGATIVE 08/30/2019 1909   KETONESUR NEGATIVE 08/30/2019 1909   PROTEINUR NEGATIVE 08/30/2019 1909   NITRITE NEGATIVE 08/30/2019 1909   LEUKOCYTESUR NEGATIVE 08/30/2019 1909    No results found for: "LABMICR", "WBCUA", "RBCUA", "LABEPIT", "MUCUS", "BACTERIA"  Pertinent Imaging:  Results for orders placed during the hospital encounter of 11/11/07  DG Abd 1 View  Narrative Clinical Data: Right renal calculus, right side abdominal pain since lithotripsy  ABDOMEN - 1 VIEW  Comparison: 11/07/2007  Findings: No evidence of renal or proximal ureteral calcification. Small bilateral pelvic  phleboliths. Multiple calcifications in right pelvis, with large right distal ureteral calculus again identified, minimally more inferior/distal within right ureter, measuring 5 x 10 mm. Bowel gas pattern normal. No acute bony abnormalities.  IMPRESSION: Persistent visualization of large distal right ureteral calculus, 10 x 5 mm, minimally more distal in position versus previous exam.  Provider: Kyra Searles   Results for orders placed during the hospital encounter of 08/30/19  CT Renal Stone Study  Narrative CLINICAL DATA:  Right-sided flank pain.  EXAM: CT ABDOMEN AND PELVIS WITHOUT CONTRAST  TECHNIQUE: Multidetector CT imaging of the abdomen and pelvis was performed following the standard protocol without IV contrast.  COMPARISON:  CT abdomen pelvis dated 02/24/2014  FINDINGS: Lower chest: No acute abnormality.  Hepatobiliary: The liver is hypoattenuating, consistent with hepatic  steatosis. No focal liver abnormality is seen. Status post cholecystectomy. No biliary dilatation.  Pancreas: Unremarkable. No pancreatic ductal dilatation or surrounding inflammatory changes.  Spleen: Normal in size without focal abnormality.  Adrenals/Urinary Tract: Adrenal glands are unremarkable. A nonobstructing right renal calculus measures 3 mm. No left renal calculi are noted. Otherwise, the kidneys are normal, without focal lesion or hydronephrosis. Bladder is unremarkable.  Stomach/Bowel: Stomach is within normal limits. Appendix appears normal. There is colonic diverticulosis without evidence of diverticulitis. No evidence of bowel wall thickening, distention, or inflammatory changes.  Vascular/Lymphatic: Aortic atherosclerosis. No enlarged abdominal or pelvic lymph nodes.  Reproductive: Prostate is unremarkable.  Other: No abdominal wall hernia or abnormality. No abdominopelvic ascites.  Musculoskeletal: No acute or significant osseous findings.  IMPRESSION: 1.  No findings to explain the patient's symptoms. 2. Hepatic steatosis. 3. Nonobstructing right renal calculus.  Aortic Atherosclerosis (ICD10-I70.0).   Electronically Signed By: Romona Curls M.D. On: 08/30/2019 20:04   Assessment & Plan:    PSA elevation - borderline elevation, no age specific. I had a long discussion with the patient on the nature of elevated PSA - benign vs malignant causes. We discussed age specific levels and that PCa can be seen on a biopsy with very low PSA levels. We discussed the nature risks and benefits of continued surveillance, other lab tests, imaging as well as prostate biopsy. We discussed the management of prostate cancer might include active surveillance or treatment depending on biopsy findings. All questions answered. PSA repeated. Consider biopsy given no significant bph on prior imaging.   Kidney stone - check KUB   ADD: repeat Jan 2025 PSA 5.2. I recommended a biopsy but [place] wants to proceed with MRI which is reasonable as well. MRI ordered.   No follow-ups on file.  [name], MD  [facility]  [address] [place], [place] [address] [phone]

## 2023-04-02 LAB — URINALYSIS, ROUTINE W REFLEX MICROSCOPIC
Bilirubin, UA: NEGATIVE
Glucose, UA: NEGATIVE
Ketones, UA: NEGATIVE
Leukocytes,UA: NEGATIVE
Nitrite, UA: NEGATIVE
Protein,UA: NEGATIVE
RBC, UA: NEGATIVE
Specific Gravity, UA: 1.02 (ref 1.005–1.030)
Urobilinogen, Ur: 0.2 mg/dL (ref 0.2–1.0)
pH, UA: 6 (ref 5.0–7.5)

## 2023-04-02 LAB — PSA: Prostate Specific Ag, Serum: 5.2 ng/mL — ABNORMAL HIGH (ref 0.0–4.0)

## 2023-04-05 ENCOUNTER — Telehealth: Payer: Self-pay

## 2023-04-05 ENCOUNTER — Other Ambulatory Visit: Payer: Self-pay

## 2023-04-05 DIAGNOSIS — R972 Elevated prostate specific antigen [PSA]: Secondary | ICD-10-CM

## 2023-04-05 NOTE — Telephone Encounter (Signed)
Pt also wanted to get another consultation before biopsy Per VM left by Pt

## 2023-04-05 NOTE — Progress Notes (Deleted)
Just wanted to confirm okay to schedule biopsy results with Pt for 02/17 @ 11:30AM

## 2023-04-05 NOTE — Telephone Encounter (Signed)
Called Pt to relay message to Pt from MD Pt has questions: should he get a MRI before the the Biopsy, I let him know that he has a xray ordered that he should get that done previous to his upcoming appt and MD can discuss results and questions then

## 2023-04-08 ENCOUNTER — Ambulatory Visit (HOSPITAL_COMMUNITY)
Admission: RE | Admit: 2023-04-08 | Discharge: 2023-04-08 | Disposition: A | Discharge status: Home / Self Care | Payer: 59 | Source: Ambulatory Visit | Attending: Urology | Admitting: Urology

## 2023-04-08 DIAGNOSIS — N200 Calculus of kidney: Secondary | ICD-10-CM | POA: Insufficient documentation

## 2023-04-08 DIAGNOSIS — I878 Other specified disorders of veins: Secondary | ICD-10-CM | POA: Diagnosis not present

## 2023-04-09 ENCOUNTER — Telehealth: Payer: Self-pay

## 2023-04-09 NOTE — Telephone Encounter (Signed)
-----   Message from [name] sent at 04/05/2023  7:43 AM EST ----- Let John Melendez know his PSA has risen to 5.2. I recommend we proceed with prostate biopsy as discussed. 1) Send in his levaquin 750 mg po x 1 morning of biopsy, 2) schedule for a TRUS bx at [facility] at 8:30 AM next available. 3) Have him see me two weeks later for biopsy results. Thank you! ----- Message ----- From: Sarajane Jews, CMA Sent: 04/02/2023   8:01 AM EST To: [name], MD  Appt 04/21 Please review.

## 2023-04-09 NOTE — Telephone Encounter (Signed)
Called Pt to let him know what MD Eskridge said per last telephone encounter Pt states he wants a MRI before biopsy and if not Pt wants to be recommended to another MD Pt states that MD said it can't be treated so he doesn't understand why he needs a biopsy pt states he studied the MRI studies more than a biopsy and prefers that first

## 2023-04-09 NOTE — Addendum Note (Signed)
Addended by: [name] R on: 04/09/2023 03:44 PM   Modules accepted: Orders

## 2023-04-09 NOTE — Telephone Encounter (Signed)
Called pt to relay message from MD Eskridge per last telephone encounter Pt said okay thank you

## 2023-04-15 ENCOUNTER — Other Ambulatory Visit: Payer: 59 | Admitting: Urology

## 2023-04-25 ENCOUNTER — Inpatient Hospital Stay: Admission: RE | Admit: 2023-04-25 | Payer: 59 | Source: Ambulatory Visit

## 2023-05-17 ENCOUNTER — Other Ambulatory Visit: Payer: 59

## 2023-05-18 LAB — PSA: Prostate Specific Ag, Serum: 4.7 ng/mL — ABNORMAL HIGH (ref 0.0–4.0)

## 2023-05-27 ENCOUNTER — Ambulatory Visit: Payer: 59 | Admitting: Urology

## 2023-06-18 DIAGNOSIS — H43822 Vitreomacular adhesion, left eye: Secondary | ICD-10-CM | POA: Diagnosis not present

## 2023-06-18 DIAGNOSIS — Z961 Presence of intraocular lens: Secondary | ICD-10-CM | POA: Diagnosis not present

## 2023-06-18 DIAGNOSIS — H401133 Primary open-angle glaucoma, bilateral, severe stage: Secondary | ICD-10-CM | POA: Diagnosis not present

## 2023-07-01 ENCOUNTER — Ambulatory Visit (INDEPENDENT_AMBULATORY_CARE_PROVIDER_SITE_OTHER): Payer: 59 | Admitting: Urology

## 2023-07-01 VITALS — BP 150/68 | HR 82

## 2023-07-01 DIAGNOSIS — R972 Elevated prostate specific antigen [PSA]: Secondary | ICD-10-CM

## 2023-07-01 DIAGNOSIS — N200 Calculus of kidney: Secondary | ICD-10-CM

## 2023-07-01 LAB — URINALYSIS, ROUTINE W REFLEX MICROSCOPIC
Bilirubin, UA: NEGATIVE
Glucose, UA: NEGATIVE
Ketones, UA: NEGATIVE
Leukocytes,UA: NEGATIVE
Nitrite, UA: NEGATIVE
Protein,UA: NEGATIVE
RBC, UA: NEGATIVE
Specific Gravity, UA: 1.02 (ref 1.005–1.030)
Urobilinogen, Ur: 1 mg/dL (ref 0.2–1.0)
pH, UA: 6 (ref 5.0–7.5)

## 2023-07-01 NOTE — Progress Notes (Unsigned)
 07/01/2023 3:00 PM   John Melendez 10-02-1945 [id number]  Referring provider: Minus Amel, MD [address] [place],  [place] [address]  No chief complaint on file.   HPI:  F/u -    1) PSA elevation - pt with a Dec 2024 PSA of 4.7. No biopsy. No FH PCa.     2) BPH - minimal BPH on CT in 2021. Pt given Rx for tamsulosin but hasn't started it.    3) urolithiasis - h/o ESWL and URS with Dr. Javaid in 2009. CT 2021 with 3 mm right stone.    Today, seen for the above. His Jan 2025 PSA 5.2. I recommended a biopsy but John Melendez wants to proceed with MRI which is reasonable as well. MRI ordered in Jan 2025. He ended up not doing the MRI either. Mar 2025 PSA down 4.7. No flank pain or stone passage. Feb 2025 KUB with 3-4 mm RLP stone.    UA negative.   No OAC. Retired from [employer].    PMH: Past Medical History:  Diagnosis Date   Anxiety    Arthritis    Depression    GERD (gastroesophageal reflux disease)    no PPI currently, symptoms controlled with diet and behavior   Glaucoma    History of kidney stones    Hypertension    Hypothyroidism    Sleep apnea     Surgical History: Past Surgical History:  Procedure Laterality Date   BACK SURGERY     ruptured lumbar disc.   CHOLECYSTECTOMY     COLONOSCOPY  02/15/14   WUX:LKGM papilla internal hemorrhoids/colonic diverticulosis/multiple colonic polyp   ESOPHAGOGASTRODUODENOSCOPY  2004   Dr. Riley Melendez: non-critical Schatzki's ring s/p dilation with 4 F, small hiatal hernia, normal D1 and D2    ESOPHAGOGASTRODUODENOSCOPY  02/15/14   [id number] schatzki's ring not manipulated/hiatal hernia   INCISIONAL HERNIA REPAIR N/A 06/10/2017   Procedure: HERNIA REPAIR INCISIONAL WITH MESH;  Surgeon: Alanda Allegra, MD;  Location: [facility];  Service: General;  Laterality: N/A;   SLT LASER APPLICATION Right 06/13/2015   Procedure: SLT LASER APPLICATION;  Surgeon: John Cummins, MD;  Location: [facility];  Service: Ophthalmology;   Laterality: Right;    Home Medications:  Allergies as of 07/01/2023       Reactions   Penicillins Nausea Only        Medication List        Accurate as of July 01, 2023  3:00 PM. If you have any questions, ask your nurse or doctor.          ALPRAZolam 0.5 MG tablet Commonly known as: XANAX Take 0.5 mg by mouth 3 (three) times daily.   brimonidine 0.2 % ophthalmic solution Commonly known as: ALPHAGAN Place 1 drop into both eyes 2 (two) times daily.   calcium carbonate 750 MG chewable tablet Commonly known as: TUMS EX Chew 1 tablet by mouth daily as needed for heartburn.   citalopram 40 MG tablet Commonly known as: CELEXA Take 20 mg by mouth daily.   diclofenac  Sodium 1 % Gel Commonly known as: Voltaren  Apply 2 g topically 4 (four) times daily as needed.   HYDROcodone -acetaminophen  5-325 MG tablet Commonly known as: NORCO/VICODIN Take 1 tablet by mouth every 12 (twelve) hours as needed.   latanoprost 0.005 % ophthalmic solution Commonly known as: XALATAN Place 1 drop into both eyes at bedtime.   levothyroxine 88 MCG tablet Commonly known as: SYNTHROID Take 88 mcg by mouth daily before breakfast.  lidocaine  5 % Commonly known as: Lidoderm  Place 1 patch onto the skin daily. Remove & Discard patch within 12 hours or as directed by MD   lisinopril 10 MG tablet Commonly known as: ZESTRIL Take 10 mg by mouth at bedtime.   loratadine 10 MG tablet Commonly known as: CLARITIN Take 10 mg by mouth daily.   LUTEIN PO Take by mouth.   multivitamin with minerals Tabs tablet Take 1 tablet by mouth daily.   naproxen  375 MG tablet Commonly known as: NAPROSYN  Take 1 tablet (375 mg total) by mouth 2 (two) times daily.   omeprazole 40 MG capsule Commonly known as: PRILOSEC Take 40 mg by mouth daily.   QUERCETIN PO Take 1 tablet by mouth daily.   Vitamin D3 50 MCG (2000 UT) capsule Take 2,000 Units by mouth daily.        Allergies:  Allergies   Allergen Reactions   Penicillins Nausea Only    Family History: Family History  Problem Relation Age of Onset   Colon cancer Neg Hx     Social History:  reports that he quit smoking about 34 years ago. His smoking use included cigarettes. He started smoking about 68 years ago. He has a 102 pack-year smoking history. He has never used smokeless tobacco. He reports that he does not drink alcohol and does not use drugs.   Physical Exam: BP (!) 150/68   Pulse 82   Constitutional:  Alert and oriented, No acute distress. HEENT: John Melendez AT, moist mucus membranes.  Trachea midline, no masses. Cardiovascular: No clubbing, cyanosis, or edema. Respiratory: Normal respiratory effort, no increased work of breathing. GI: Abdomen is soft, nontender, nondistended, no abdominal masses GU: No CVA tenderness Skin: No rashes, bruises or suspicious lesions. Neurologic: Grossly intact, no focal deficits, moving all 4 extremities. Psychiatric: Normal mood and affect.  Laboratory Data: Lab Results  Component Value Date   WBC 6.8 08/30/2019   HGB 15.2 08/30/2019   HCT 45.5 08/30/2019   MCV 96.6 08/30/2019   PLT 208 08/30/2019    Lab Results  Component Value Date   CREATININE 1.00 08/30/2019    No results found for: "PSA"  No results found for: "TESTOSTERONE"  No results found for: "HGBA1C"  Urinalysis    Component Value Date/Time   COLORURINE STRAW (A) 08/30/2019 1909   APPEARANCEUR Clear 04/01/2023 1541   LABSPEC 1.005 08/30/2019 1909   PHURINE 7.0 08/30/2019 1909   GLUCOSEU Negative 04/01/2023 1541   HGBUR NEGATIVE 08/30/2019 1909   BILIRUBINUR Negative 04/01/2023 1541   KETONESUR NEGATIVE 08/30/2019 1909   PROTEINUR Negative 04/01/2023 1541   PROTEINUR NEGATIVE 08/30/2019 1909   NITRITE Negative 04/01/2023 1541   NITRITE NEGATIVE 08/30/2019 1909   LEUKOCYTESUR Negative 04/01/2023 1541   LEUKOCYTESUR NEGATIVE 08/30/2019 1909    Lab Results  Component Value Date   LABMICR  Comment 04/01/2023    Pertinent Imaging:  Results for orders placed during the hospital encounter of 04/08/23  DG Abd 1 View  Narrative CLINICAL DATA:  Urolithiasis.  Kidney stones.  EXAM: ABDOMEN - 1 VIEW  COMPARISON:  CT 08/30/2019  FINDINGS: Right renal calculus on prior CT is potentially visualized projecting over the lower pole. The upper pole stone is not definitively seen. No visualized left-sided urolithiasis. There are multiple pelvic phleboliths and vascular calcifications. Cholecystectomy clips in the right upper quadrant. Normal bowel gas pattern, small to moderate colonic stool burden.  IMPRESSION: Right renal calculus on prior CT is potentially visualized projecting over the  lower pole. The upper pole stone is not definitively seen.   Electronically Signed By: Chadwick Colonel M.D. On: 04/16/2023 15:36   Results for orders placed during the hospital encounter of 08/30/19  CT Renal Stone Study  Narrative CLINICAL DATA:  Right-sided flank pain.  EXAM: CT ABDOMEN AND PELVIS WITHOUT CONTRAST  TECHNIQUE: Multidetector CT imaging of the abdomen and pelvis was performed following the standard protocol without IV contrast.  COMPARISON:  CT abdomen pelvis dated 02/24/2014  FINDINGS: Lower chest: No acute abnormality.  Hepatobiliary: The liver is hypoattenuating, consistent with hepatic steatosis. No focal liver abnormality is seen. Status post cholecystectomy. No biliary dilatation.  Pancreas: Unremarkable. No pancreatic ductal dilatation or surrounding inflammatory changes.  Spleen: Normal in size without focal abnormality.  Adrenals/Urinary Tract: Adrenal glands are unremarkable. A nonobstructing right renal calculus measures 3 mm. No left renal calculi are noted. Otherwise, the kidneys are normal, without focal lesion or hydronephrosis. Bladder is unremarkable.  Stomach/Bowel: Stomach is within normal limits. Appendix appears normal. There  is colonic diverticulosis without evidence of diverticulitis. No evidence of bowel wall thickening, distention, or inflammatory changes.  Vascular/Lymphatic: Aortic atherosclerosis. No enlarged abdominal or pelvic lymph nodes.  Reproductive: Prostate is unremarkable.  Other: No abdominal wall hernia or abnormality. No abdominopelvic ascites.  Musculoskeletal: No acute or significant osseous findings.  IMPRESSION: 1. No findings to explain the patient's symptoms. 2. Hepatic steatosis. 3. Nonobstructing right renal calculus.  Aortic Atherosclerosis (ICD10-I70.0).   Electronically Signed By: Tita Form M.D. On: 08/30/2019 20:04   Assessment & Plan:    1. Elevated PSA (Primary) Discussed with Clerance again etiology of PSA elevation - benign and malignant. Given PSA decline some risk calculators put him at 12% risk of HG PCa. Discussed nature r/b of stopping PSA screening, surveillance of PSA, trus bx and pMRI. He'll check a PSA in 6 mo but "might cancel it".   - Urinalysis, Routine w reflex microscopic  2. kidney stone - reviewed KUB and nature r/b of surveillance.   No follow-ups on file.  Christina Coyer, MD  [facility]  [address] [place], [place] [address] [phone]

## 2023-07-19 DIAGNOSIS — Z6826 Body mass index (BMI) 26.0-26.9, adult: Secondary | ICD-10-CM | POA: Diagnosis not present

## 2023-07-19 DIAGNOSIS — K219 Gastro-esophageal reflux disease without esophagitis: Secondary | ICD-10-CM | POA: Diagnosis not present

## 2023-07-19 DIAGNOSIS — B351 Tinea unguium: Secondary | ICD-10-CM | POA: Diagnosis not present

## 2023-07-19 DIAGNOSIS — F419 Anxiety disorder, unspecified: Secondary | ICD-10-CM | POA: Diagnosis not present

## 2023-07-19 DIAGNOSIS — E663 Overweight: Secondary | ICD-10-CM | POA: Diagnosis not present

## 2023-07-19 DIAGNOSIS — L409 Psoriasis, unspecified: Secondary | ICD-10-CM | POA: Diagnosis not present

## 2023-10-07 ENCOUNTER — Ambulatory Visit (INDEPENDENT_AMBULATORY_CARE_PROVIDER_SITE_OTHER): Admitting: Podiatry

## 2023-10-07 ENCOUNTER — Encounter: Payer: Self-pay | Admitting: Podiatry

## 2023-10-07 DIAGNOSIS — M79675 Pain in left toe(s): Secondary | ICD-10-CM | POA: Diagnosis not present

## 2023-10-07 DIAGNOSIS — B351 Tinea unguium: Secondary | ICD-10-CM

## 2023-10-07 DIAGNOSIS — M79674 Pain in right toe(s): Secondary | ICD-10-CM

## 2023-10-09 NOTE — Progress Notes (Signed)
 Subjective:   Patient ID: John Melendez, male   DOB: 78 y.o.   MRN: [id number]   HPI Patient presents with elongated nailbeds 1-5 both feet that are thickened and dystrophic and impossible for him to cut.  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

## 2023-10-28 ENCOUNTER — Telehealth: Payer: Self-pay | Admitting: Gastroenterology

## 2023-10-28 NOTE — Telephone Encounter (Signed)
 Patient left a message asking that we send his records to [phone]... unsure which office this is but this is all that the patient left ... Sent the records we had from 02/2023.

## 2023-11-20 DIAGNOSIS — R109 Unspecified abdominal pain: Secondary | ICD-10-CM | POA: Diagnosis not present

## 2023-11-20 DIAGNOSIS — K219 Gastro-esophageal reflux disease without esophagitis: Secondary | ICD-10-CM | POA: Diagnosis not present

## 2023-11-20 DIAGNOSIS — Z86018 Personal history of other benign neoplasm: Secondary | ICD-10-CM | POA: Diagnosis not present

## 2023-12-18 DIAGNOSIS — D120 Benign neoplasm of cecum: Secondary | ICD-10-CM | POA: Diagnosis not present

## 2023-12-18 DIAGNOSIS — D125 Benign neoplasm of sigmoid colon: Secondary | ICD-10-CM | POA: Diagnosis not present

## 2023-12-18 DIAGNOSIS — D123 Benign neoplasm of transverse colon: Secondary | ICD-10-CM | POA: Diagnosis not present

## 2023-12-18 DIAGNOSIS — D124 Benign neoplasm of descending colon: Secondary | ICD-10-CM | POA: Diagnosis not present

## 2023-12-18 DIAGNOSIS — K5730 Diverticulosis of large intestine without perforation or abscess without bleeding: Secondary | ICD-10-CM | POA: Diagnosis not present

## 2023-12-18 DIAGNOSIS — K222 Esophageal obstruction: Secondary | ICD-10-CM | POA: Diagnosis not present

## 2023-12-18 DIAGNOSIS — B3781 Candidal esophagitis: Secondary | ICD-10-CM | POA: Diagnosis not present

## 2023-12-18 DIAGNOSIS — Z860100 Personal history of colon polyps, unspecified: Secondary | ICD-10-CM | POA: Diagnosis not present

## 2023-12-18 DIAGNOSIS — K2950 Unspecified chronic gastritis without bleeding: Secondary | ICD-10-CM | POA: Diagnosis not present

## 2023-12-18 DIAGNOSIS — R12 Heartburn: Secondary | ICD-10-CM | POA: Diagnosis not present

## 2023-12-18 DIAGNOSIS — K2930 Chronic superficial gastritis without bleeding: Secondary | ICD-10-CM | POA: Diagnosis not present

## 2023-12-18 DIAGNOSIS — K2289 Other specified disease of esophagus: Secondary | ICD-10-CM | POA: Diagnosis not present

## 2023-12-18 DIAGNOSIS — Z09 Encounter for follow-up examination after completed treatment for conditions other than malignant neoplasm: Secondary | ICD-10-CM | POA: Diagnosis not present

## 2023-12-18 DIAGNOSIS — K317 Polyp of stomach and duodenum: Secondary | ICD-10-CM | POA: Diagnosis not present

## 2023-12-19 ENCOUNTER — Encounter: Payer: Self-pay | Admitting: Gastroenterology

## 2023-12-19 ENCOUNTER — Other Ambulatory Visit: Payer: Self-pay | Admitting: Gastroenterology

## 2023-12-19 DIAGNOSIS — R1033 Periumbilical pain: Secondary | ICD-10-CM

## 2023-12-19 DIAGNOSIS — R1084 Generalized abdominal pain: Secondary | ICD-10-CM

## 2023-12-23 ENCOUNTER — Other Ambulatory Visit

## 2023-12-24 ENCOUNTER — Encounter: Payer: Self-pay | Admitting: Radiology

## 2023-12-25 ENCOUNTER — Other Ambulatory Visit

## 2023-12-25 ENCOUNTER — Ambulatory Visit
Admission: RE | Admit: 2023-12-25 | Discharge: 2023-12-25 | Disposition: A | Discharge status: Home / Self Care | Source: Ambulatory Visit | Attending: Gastroenterology | Admitting: Gastroenterology

## 2023-12-25 DIAGNOSIS — R1084 Generalized abdominal pain: Secondary | ICD-10-CM

## 2023-12-25 DIAGNOSIS — K760 Fatty (change of) liver, not elsewhere classified: Secondary | ICD-10-CM | POA: Diagnosis not present

## 2023-12-25 DIAGNOSIS — R1033 Periumbilical pain: Secondary | ICD-10-CM

## 2023-12-25 MED ORDER — IOPAMIDOL (ISOVUE-300) INJECTION 61%
100.0000 mL | Freq: Once | INTRAVENOUS | Status: AC | PRN
  Administered 2023-12-25: 100 mL via INTRAVENOUS

## 2023-12-30 ENCOUNTER — Ambulatory Visit: Admitting: Urology

## 2024-01-11 ENCOUNTER — Other Ambulatory Visit: Payer: Self-pay

## 2024-01-11 ENCOUNTER — Encounter: Payer: Self-pay | Admitting: Emergency Medicine

## 2024-01-11 ENCOUNTER — Ambulatory Visit: Admission: EM | Admit: 2024-01-11 | Discharge: 2024-01-11 | Disposition: A | Discharge status: Home / Self Care

## 2024-01-11 DIAGNOSIS — I776 Arteritis, unspecified: Secondary | ICD-10-CM

## 2024-01-11 DIAGNOSIS — R21 Rash and other nonspecific skin eruption: Secondary | ICD-10-CM | POA: Diagnosis not present

## 2024-01-11 MED ORDER — TRIAMCINOLONE ACETONIDE 0.1 % EX CREA: 1.0000 | TOPICAL_CREAM | Freq: Two times a day (BID) | CUTANEOUS | 0 refills | Status: AC

## 2024-01-11 NOTE — ED Triage Notes (Signed)
 Pt reports started feeling sick and reports has taken coricidin, leftover abx taper for symptoms. Intermittent fever and elevated bp since last week. Has seen by pcp last week and was prescribed ibuprofen and doxycycline  and steroid injection. Reports chills,rash, general malaise continue.

## 2024-01-11 NOTE — ED Provider Notes (Signed)
[facility]    CSN: [id number] Arrival date & time: 01/11/24  1348      History   Chief Complaint Chief Complaint  Patient presents with   Rash    HPI John Melendez is a 78 y.o. male.   Patient presenting today stating he started about 5 or 6 days ago with chills, malaise, fever.  States he started taking Coricidin HBP and some leftover azithromycin but then noticed a widespread rash so stopped both of the medications and followed up with primary care the following day.  He was given a steroid shot and doxycycline  at that visit but states he was not tested for anything so he is unsure what he is being treated for.  Not currently trying anything over-the-counter for symptoms.  Denies chest pain, shortness of breath, cough, congestion, abdominal pain, vomiting, diarrhea, new foods or hygiene products, new outdoor exposures.  Has been on the doxycycline  for multiple days now.    Past Medical History:  Diagnosis Date   Anxiety    Arthritis    Depression    GERD (gastroesophageal reflux disease)    no PPI currently, symptoms controlled with diet and behavior   Glaucoma    History of kidney stones    Hypertension    Hypothyroidism    Sleep apnea     Patient Active Problem List   Diagnosis Date Noted   Lower abdominal pain 02/22/2023   Incisional hernia, without obstruction or gangrene    History of colonic polyps    Diverticulosis of colon without hemorrhage    Hiatal hernia    Abdominal pain, epigastric 01/26/2014   Encounter for screening colonoscopy 10/27/2013    Past Surgical History:  Procedure Laterality Date   BACK SURGERY     ruptured lumbar disc.   CHOLECYSTECTOMY     COLONOSCOPY  02/15/14   MFM:jwjo papilla internal hemorrhoids/colonic diverticulosis/multiple colonic polyp   ESOPHAGOGASTRODUODENOSCOPY  2004   Dr. Shaaron: non-critical Schatzki's ring s/p dilation with 47 F, small hiatal hernia, normal D1 and D2    ESOPHAGOGASTRODUODENOSCOPY   02/15/14   MFM:wnwrmpuprjo schatzki's ring not manipulated/hiatal hernia   INCISIONAL HERNIA REPAIR N/A 06/10/2017   Procedure: HERNIA REPAIR INCISIONAL WITH MESH;  Surgeon: Mavis Anes, MD;  Location: [facility];  Service: General;  Laterality: N/A;   SLT LASER APPLICATION Right 06/13/2015   Procedure: SLT LASER APPLICATION;  Surgeon: Dow JULIANNA Burke, MD;  Location: [facility];  Service: Ophthalmology;  Laterality: Right;       Home Medications    Prior to Admission medications   Medication Sig Start Date End Date Taking? Authorizing Provider  doxycycline  (VIBRAMYCIN ) 100 MG capsule Take 100 mg by mouth 2 (two) times daily. 01/09/24  Yes [provider]  triamcinolone  cream (KENALOG ) 0.1 % Apply 1 Application topically 2 (two) times daily. 01/11/24  Yes Stuart Vernell Norris, PA-C  ALPRAZolam (XANAX) 0.5 MG tablet Take 0.5 mg by mouth 3 (three) times daily.     [provider]  brimonidine (ALPHAGAN) 0.2 % ophthalmic solution Place 1 drop into both eyes 2 (two) times daily.     [provider]  calcium carbonate (TUMS EX) 750 MG chewable tablet Chew 1 tablet by mouth daily as needed for heartburn.     [provider]  Cholecalciferol (VITAMIN D3) 50 MCG (2000 UT) capsule Take 2,000 Units by mouth daily.    [provider]  citalopram (CELEXA) 40 MG tablet Take 20 mg by mouth daily.  03/28/19   [provider]  diclofenac  Sodium (VOLTAREN ) 1 % GEL Apply 2 g topically 4 (four) times daily as needed. 03/26/20   Long, Joshua G, MD  dorzolamide-timolol (COSOPT) 2-0.5 % ophthalmic solution 1 drop into affected eye Ophthalmic Twice a day; Duration: 90 days    [provider]  HYDROcodone -acetaminophen  (NORCO/VICODIN) 5-325 MG tablet Take 1 tablet by mouth every 12 (twelve) hours as needed. 08/30/19   Couture, Cortni S, PA-C  latanoprost (XALATAN) 0.005 % ophthalmic solution Place 1 drop into both eyes at bedtime.     [provider]   levothyroxine (SYNTHROID) 88 MCG tablet Take 88 mcg by mouth daily before breakfast.     [provider]  lidocaine  (LIDODERM ) 5 % Place 1 patch onto the skin daily. Remove & Discard patch within 12 hours or as directed by MD 07/27/22   Kommor, Lum, MD  lisinopril (PRINIVIL,ZESTRIL) 10 MG tablet Take 10 mg by mouth at bedtime.     [provider]  loratadine (CLARITIN) 10 MG tablet Take 10 mg by mouth daily.    [provider]  LUTEIN PO Take by mouth.    [provider]  Multiple Vitamin (MULTIVITAMIN WITH MINERALS) TABS tablet Take 1 tablet by mouth daily.    [provider]  naproxen  (NAPROSYN ) 375 MG tablet Take 1 tablet (375 mg total) by mouth 2 (two) times daily. 07/27/22   Kommor, Madison, MD  omeprazole (PRILOSEC) 40 MG capsule Take 40 mg by mouth daily.    [provider]  QUERCETIN PO Take 1 tablet by mouth daily.    [provider]  Zinc 50 MG TABS 1 tablet Orally Once a day    [provider]    Family History Family History  Problem Relation Age of Onset   Colon cancer Neg Hx     Social History Social History   Tobacco Use   Smoking status: Former    Current packs/day: 0.00    Average packs/day: 3.0 packs/day for 34.0 years (102.0 ttl pk-yrs)    Types: Cigarettes    Start date: 06/07/1955    Quit date: 06/06/1989    Years since quitting: 34.6   Smokeless tobacco: Never  Vaping Use   Vaping status: Never Used  Substance Use Topics   Alcohol use: No   Drug use: No     Allergies   Penicillins   Review of Systems Review of Systems Per HPI  Physical Exam Triage Vital Signs ED Triage Vitals  Encounter Vitals Group     BP 01/11/24 1440 131/75     Girls Systolic BP Percentile --      Girls Diastolic BP Percentile --      Boys Systolic BP Percentile --      Boys Diastolic BP Percentile --      Pulse Rate 01/11/24 1440 77     Resp 01/11/24 1440 20     Temp 01/11/24 1440 99.4 F (37.4  C)     Temp Source 01/11/24 1440 Oral     SpO2 01/11/24 1440 95 %     Weight --      Height --      Head Circumference --      Peak Flow --      Pain Score 01/11/24 1437 0     Pain Loc --      Pain Education --      Exclude from Growth Chart --    No data found.  Updated Vital Signs BP 131/75 (BP Location: Right Arm)   Pulse 77   Temp 99.4 F (37.4 C) (Oral)   Resp 20   SpO2 95%   Visual Acuity Right Eye Distance:   Left Eye Distance:   Bilateral Distance:    Right Eye Near:   Left Eye Near:    Bilateral Near:     Physical Exam Vitals and nursing note reviewed.  Constitutional:      Appearance: Normal appearance.  HENT:     Head: Atraumatic.  Eyes:     Extraocular Movements: Extraocular movements intact.     Conjunctiva/sclera: Conjunctivae normal.  Cardiovascular:     Rate and Rhythm: Normal rate and regular rhythm.  Pulmonary:     Effort: Pulmonary effort is normal.     Breath sounds: Normal breath sounds.  Musculoskeletal:        General: Normal range of motion.     Cervical back: Normal range of motion and neck supple.  Skin:    General: Skin is warm and dry.     Comments: Has a new erythematous urticarial type area to the dorsal left hand that he states just came up today and is itchy, widespread nonblanchable vasculitis type pinpoint rash across legs bilaterally, similar lesions to chest and arms  Neurological:     General: No focal deficit present.     Mental Status: He is oriented to person, place, and time.  Psychiatric:        Mood and Affect: Mood normal.        Thought Content: Thought content normal.        Judgment: Judgment normal.      UC Treatments / Results  Labs (all labs ordered are listed, but only abnormal results are displayed) Labs Reviewed  CBC WITH DIFFERENTIAL/PLATELET    EKG   Radiology No results found.  Procedures Procedures (including critical care time)  Medications Ordered in UC Medications - No data to  display  Initial Impression / Assessment and Plan / UC Course  I have reviewed the triage vital signs and the nursing notes.  Pertinent labs & imaging results that were available during my care of the patient were reviewed by me and considered in my medical decision making (see chart for details).     CBC pending given vascular appearance of portions of rash.  Triamcinolone  sent for the itchy rash to his hand.  Unclear etiology at this time, however already on broad-spectrum antibiotic and has received a steroid shot.  Continue supportive home care, follow-up for worsening or unresolving symptoms.  Final Clinical Impressions(s) / UC Diagnoses   Final diagnoses:  Vasculitis  Rash     Discharge Instructions      I have sent over some triamcinolone  cream for the itchy patches such as the one currently on your left hand.  I have also sent out blood counts to further evaluate your rash particularly the rash I see on your legs.  Continue the doxycycline , and the steroid shot should be helpful even in the days to come as well.  Follow-up for worsening or unresolving symptoms and we will let you know if your labs come back abnormal.    ED Prescriptions     Medication Sig Dispense Auth. Provider   triamcinolone  cream (KENALOG ) 0.1 % Apply 1 Application topically 2 (two) times daily. 80 g Stuart Vernell Norris, [place]      PDMP not reviewed this encounter.   Stuart Vernell Norris, [place] 01/11/24  1558  

## 2024-01-11 NOTE — Discharge Instructions (Signed)
 I have sent over some triamcinolone  cream for the itchy patches such as the one currently on your left hand.  I have also sent out blood counts to further evaluate your rash particularly the rash I see on your legs.  Continue the doxycycline , and the steroid shot should be helpful even in the days to come as well.  Follow-up for worsening or unresolving symptoms and we will let you know if your labs come back abnormal.

## 2024-01-12 LAB — CBC WITH DIFFERENTIAL/PLATELET
Basophils Absolute: 0 x10E3/uL (ref 0.0–0.2)
Basos: 0 %
EOS (ABSOLUTE): 0.4 x10E3/uL (ref 0.0–0.4)
Eos: 6 %
Hematocrit: 46.6 % (ref 37.5–51.0)
Hemoglobin: 15.4 g/dL (ref 13.0–17.7)
Immature Grans (Abs): 0 x10E3/uL (ref 0.0–0.1)
Immature Granulocytes: 0 %
Lymphocytes Absolute: 1.3 x10E3/uL (ref 0.7–3.1)
Lymphs: 20 %
MCH: 32.2 pg (ref 26.6–33.0)
MCHC: 33 g/dL (ref 31.5–35.7)
MCV: 97 fL (ref 79–97)
Monocytes Absolute: 0.5 x10E3/uL (ref 0.1–0.9)
Monocytes: 7 %
Neutrophils Absolute: 4.4 x10E3/uL (ref 1.4–7.0)
Neutrophils: 67 %
Platelets: 295 x10E3/uL (ref 150–450)
RBC: 4.79 x10E6/uL (ref 4.14–5.80)
RDW: 11.9 % (ref 11.6–15.4)
WBC: 6.7 x10E3/uL (ref 3.4–10.8)

## 2024-01-13 ENCOUNTER — Ambulatory Visit (HOSPITAL_COMMUNITY): Payer: Self-pay

## 2024-01-14 ENCOUNTER — Emergency Department (HOSPITAL_COMMUNITY)
Admission: EM | Admit: 2024-01-14 | Discharge: 2024-01-15 | Disposition: A | Discharge status: Home / Self Care | Attending: Emergency Medicine | Admitting: Emergency Medicine

## 2024-01-14 ENCOUNTER — Other Ambulatory Visit: Payer: Self-pay

## 2024-01-14 ENCOUNTER — Encounter (HOSPITAL_COMMUNITY): Payer: Self-pay

## 2024-01-14 DIAGNOSIS — R21 Rash and other nonspecific skin eruption: Secondary | ICD-10-CM

## 2024-01-14 DIAGNOSIS — L539 Erythematous condition, unspecified: Secondary | ICD-10-CM | POA: Insufficient documentation

## 2024-01-14 LAB — CBC WITH DIFFERENTIAL/PLATELET
Abs Immature Granulocytes: 0.07 K/uL (ref 0.00–0.07)
Basophils Absolute: 0 K/uL (ref 0.0–0.1)
Basophils Relative: 0 %
Eosinophils Absolute: 0.6 K/uL — ABNORMAL HIGH (ref 0.0–0.5)
Eosinophils Relative: 7 %
HCT: 40 % (ref 39.0–52.0)
Hemoglobin: 13.4 g/dL (ref 13.0–17.0)
Immature Granulocytes: 1 %
Lymphocytes Relative: 30 %
Lymphs Abs: 2.6 K/uL (ref 0.7–4.0)
MCH: 31.9 pg (ref 26.0–34.0)
MCHC: 33.5 g/dL (ref 30.0–36.0)
MCV: 95.2 fL (ref 80.0–100.0)
Monocytes Absolute: 0.7 K/uL (ref 0.1–1.0)
Monocytes Relative: 8 %
Neutro Abs: 4.6 K/uL (ref 1.7–7.7)
Neutrophils Relative %: 54 %
Platelets: 332 K/uL (ref 150–400)
RBC: 4.2 MIL/uL — ABNORMAL LOW (ref 4.22–5.81)
RDW: 12.1 % (ref 11.5–15.5)
WBC: 8.6 K/uL (ref 4.0–10.5)
nRBC: 0 % (ref 0.0–0.2)

## 2024-01-14 LAB — COMPREHENSIVE METABOLIC PANEL WITH GFR
ALT: 131 U/L — ABNORMAL HIGH (ref 0–44)
AST: 74 U/L — ABNORMAL HIGH (ref 15–41)
Albumin: 3 g/dL — ABNORMAL LOW (ref 3.5–5.0)
Alkaline Phosphatase: 105 U/L (ref 38–126)
Anion gap: 9 (ref 5–15)
BUN: 15 mg/dL (ref 8–23)
CO2: 27 mmol/L (ref 22–32)
Calcium: 9 mg/dL (ref 8.9–10.3)
Chloride: 103 mmol/L (ref 98–111)
Creatinine, Ser: 1.3 mg/dL — ABNORMAL HIGH (ref 0.61–1.24)
GFR, Estimated: 56 mL/min — ABNORMAL LOW (ref >60)
Glucose, Bld: 137 mg/dL — ABNORMAL HIGH (ref 70–99)
Potassium: 4.1 mmol/L (ref 3.5–5.1)
Sodium: 139 mmol/L (ref 135–145)
Total Bilirubin: 0.6 mg/dL (ref 0.0–1.2)
Total Protein: 6.4 g/dL — ABNORMAL LOW (ref 6.5–8.1)

## 2024-01-14 LAB — C-REACTIVE PROTEIN: CRP: 2 mg/dL — ABNORMAL HIGH (ref <1.0)

## 2024-01-14 LAB — PROTIME-INR
INR: 1.1 (ref 0.8–1.2)
Prothrombin Time: 15.3 s — ABNORMAL HIGH (ref 11.4–15.2)

## 2024-01-14 LAB — APTT: aPTT: 29 s (ref 24–36)

## 2024-01-14 LAB — SEDIMENTATION RATE: Sed Rate: 21 mm/h — ABNORMAL HIGH (ref 0–16)

## 2024-01-14 MED ORDER — CALCIUM CARBONATE ANTACID 500 MG PO CHEW: 750.0000 mg | CHEWABLE_TABLET | Freq: Every day | ORAL | Status: DC | PRN

## 2024-01-14 MED ORDER — BRIMONIDINE TARTRATE 0.2 % OP SOLN
1.0000 [drp] | Freq: Two times a day (BID) | OPHTHALMIC | Status: DC
  Administered 2024-01-15 (×2): 1 [drp] via OPHTHALMIC
  Filled 2024-01-14: qty 5

## 2024-01-14 MED ORDER — ALPRAZOLAM 0.25 MG PO TABS
0.5000 mg | ORAL_TABLET | Freq: Three times a day (TID) | ORAL | Status: DC
  Administered 2024-01-14 – 2024-01-15 (×3): 0.5 mg via ORAL
  Filled 2024-01-14 (×3): qty 2

## 2024-01-14 MED ORDER — METHYLPREDNISOLONE SODIUM SUCC 125 MG IJ SOLR: 125.0000 mg | Freq: Once | INTRAMUSCULAR | Status: DC

## 2024-01-14 MED ORDER — LACTATED RINGERS IV SOLN: INTRAVENOUS | Status: DC

## 2024-01-14 MED ORDER — IBUPROFEN 400 MG PO TABS
400.0000 mg | ORAL_TABLET | Freq: Once | ORAL | Status: AC
  Administered 2024-01-14: 400 mg via ORAL
  Filled 2024-01-14: qty 1

## 2024-01-14 MED ORDER — DORZOLAMIDE HCL-TIMOLOL MAL 2-0.5 % OP SOLN
1.0000 [drp] | Freq: Two times a day (BID) | OPHTHALMIC | Status: DC
  Administered 2024-01-15 (×2): 1 [drp] via OPHTHALMIC
  Filled 2024-01-14: qty 10

## 2024-01-14 MED ORDER — CITALOPRAM HYDROBROMIDE 10 MG PO TABS
20.0000 mg | ORAL_TABLET | Freq: Every day | ORAL | Status: DC
  Administered 2024-01-15: 20 mg via ORAL
  Filled 2024-01-14: qty 2

## 2024-01-14 NOTE — ED Provider Notes (Addendum)
 Concern for Maritza Goldsborough-Johnson syndrome.  Blistering rash has begun on the hand which is new since yesterday.  Eye involvement today.  Reached out to our ICU, given that we do not have dermatology, patient is unable to remain in her hospital.  Currently awaiting callback from [facility].  Reassessment 11:35 PM-spoke with Dr.Dinkins from dermatology.  We discussed the time course of the patient's rash, and I shared images that they were able to you.  They were uncertain if this was Jermiya Reichl-Johnson syndrome, and expressed concern about being able to diagnose this via images.  Question if the patient was appropriate for follow-up in their clinic.  I explained that based on the patient's symptoms and seeming progression, I do not believe he is appropriate for follow-up, and would require an in person evaluation.  Transfer center is reaching out to the hospitalist service.      Mannie Pac T, DO 01/14/24 2304    Mannie Pac T, DO 01/14/24 2337

## 2024-01-14 NOTE — ED Triage Notes (Signed)
 Pt family reports that he started having fever and a rash on October 29th

## 2024-01-14 NOTE — ED Provider Triage Note (Signed)
 Emergency Medicine Provider Triage Evaluation Note  John Melendez , a 78 y.o. male  was evaluated in triage.  Pt complains of a full-body rash.  Patient saw his physician today and was advised to come to the emergency department for evaluation for possible Stevens-Johnson syndrome.  Patient reports the rash began after taking Zithromax and doxycycline .  Patient reports taking an old prescription of Zithromax and then a provider started him on doxycycline .  Rash began on October 29.  Patient complains of fever, headache body aches and rash  Review of Systems  Positive: fever Negative: No oral rash  Physical Exam  BP (!) 162/144   Pulse 92   Temp 97.9 F (36.6 C) (Oral)   Resp 18   Ht 6' (1.829 m)   Wt 91.6 kg   SpO2 95%   BMI 27.40 kg/m  Gen:   Awake, no distress  injected left conjunctiva Resp:  Normal effort  MSK:   Moves extremities without difficulty  Other:  Red area dorsal left hand, scattered red rash,   Medical Decision Making  Medically screening exam initiated at 5:15 PM.  Appropriate orders placed.  John Melendez was informed that the remainder of the evaluation will be completed by another provider, this initial triage assessment does not replace that evaluation, and the importance of remaining in the ED until their evaluation is complete.  Labs ordered   Flint Sonny POUR, PA-C 01/14/24 1723

## 2024-01-14 NOTE — ED Provider Notes (Signed)
 [facility] AT [facility] Provider Note   CSN: [id number] Arrival date & time: 01/14/24  1554     Patient presents with: Rash and Fever   John Melendez is a 78 y.o. male.   Patient reports that he began feeling sick October 24 or 25.  Patient states he was taking Corocidin and it did not help his symptoms.  Patient states that he remembered that he had a Z-Pak.  Patient states he took 2 doses of azithromycin on the 26.  Patient states the next day he had a full body rash.  Patient states he went to the urgent care in [place] and was given an injection of steroids and told to stop the Zithromax and placed on doxycycline .  Patient reports that by the 29th his rash was worse.  He went to the [facility] urgent care on [address] in [place] and was evaluated.  Patient states that they did a CBC to check his white blood cell count and his platelets and these were normal.  Patient reports that his rash has improved however he has had increased redness to the left hand.  Patient states this a.m. when he got up his right eye was red and irritated.  Patient went to see his primary care physician today who sent him to the emergency department for evaluation of possible Stevens-Johnson syndrome.  Patient reports he has felt like he has had a fever he is having joint pains and bodyaches.  Patient denies any tick bites.  He denies any exposure to anyone with flu or COVID.  The history is provided by the patient and the spouse. No language interpreter was used.  Rash Location:  Full body Associated symptoms: fever   Fever Associated symptoms: chills and rash        Prior to Admission medications   Medication Sig Start Date End Date Taking? Authorizing Provider  ALPRAZolam (XANAX) 0.5 MG tablet Take 0.5 mg by mouth 3 (three) times daily.     [provider]  brimonidine (ALPHAGAN) 0.2 % ophthalmic solution Place 1 drop into both eyes 2 (two) times  daily.     [provider]  calcium carbonate (TUMS EX) 750 MG chewable tablet Chew 1 tablet by mouth daily as needed for heartburn.     [provider]  Cholecalciferol (VITAMIN D3) 50 MCG (2000 UT) capsule Take 2,000 Units by mouth daily.    [provider]  citalopram (CELEXA) 40 MG tablet Take 20 mg by mouth daily.  03/28/19   [provider]  diclofenac  Sodium (VOLTAREN ) 1 % GEL Apply 2 g topically 4 (four) times daily as needed. 03/26/20   Long, Joshua G, MD  dorzolamide-timolol (COSOPT) 2-0.5 % ophthalmic solution 1 drop into affected eye Ophthalmic Twice a day; Duration: 90 days    [provider]  doxycycline  (VIBRAMYCIN ) 100 MG capsule Take 100 mg by mouth 2 (two) times daily. 01/09/24   [provider]  HYDROcodone -acetaminophen  (NORCO/VICODIN) 5-325 MG tablet Take 1 tablet by mouth every 12 (twelve) hours as needed. 08/30/19   Couture, Cortni S, PA-C  latanoprost (XALATAN) 0.005 % ophthalmic solution Place 1 drop into both eyes at bedtime.     [provider]  levothyroxine (SYNTHROID) 88 MCG tablet Take 88 mcg by mouth daily before breakfast.     [provider]  lidocaine  (LIDODERM ) 5 % Place 1 patch onto the skin daily. Remove & Discard patch within 12 hours or as  directed by MD 07/27/22   Kommor, Lum, MD  lisinopril (PRINIVIL,ZESTRIL) 10 MG tablet Take 10 mg by mouth at bedtime.     [provider]  loratadine (CLARITIN) 10 MG tablet Take 10 mg by mouth daily.    [provider]  LUTEIN PO Take by mouth.    [provider]  Multiple Vitamin (MULTIVITAMIN WITH MINERALS) TABS tablet Take 1 tablet by mouth daily.    [provider]  naproxen  (NAPROSYN ) 375 MG tablet Take 1 tablet (375 mg total) by mouth 2 (two) times daily. 07/27/22   Kommor, Madison, MD  omeprazole (PRILOSEC) 40 MG capsule Take 40 mg by mouth daily.    [provider]  QUERCETIN PO Take 1 tablet by mouth  daily.    [provider]  triamcinolone  cream (KENALOG ) 0.1 % Apply 1 Application topically 2 (two) times daily. 01/11/24   Stuart Vernell Norris, PA-C  Zinc 50 MG TABS 1 tablet Orally Once a day    [provider]    Allergies: Penicillins    Review of Systems  Constitutional:  Positive for chills and fever.  Skin:  Positive for rash.  All other systems reviewed and are negative.   Updated Vital Signs BP (!) 155/79   Pulse 87   Temp 98.6 F (37 C) (Oral)   Resp 18   Ht 6' (1.829 m)   Wt 91.6 kg   SpO2 98%   BMI 27.40 kg/m   Physical Exam Vitals and nursing note reviewed.  Constitutional:      Appearance: He is well-developed.  HENT:     Head: Normocephalic.  Eyes:     Extraocular Movements: Extraocular movements intact.     Pupils: Pupils are equal, round, and reactive to light.     Comments: Injected right conjunctiva with submembranous formation at the 9 o'clock position.  Cardiovascular:     Rate and Rhythm: Normal rate.  Pulmonary:     Effort: Pulmonary effort is normal.  Abdominal:     General: There is no distension.  Musculoskeletal:        General: Normal range of motion.     Cervical back: Normal range of motion.     Comments: Left hand erythema approximately 10 x 12 cm area dorsal hand.  From, neurovascular and neurosensory intact.  Skin:    General: Skin is warm.     Comments: Faint rash chest and abdomen abdomen  Neurological:     General: No focal deficit present.     Mental Status: He is alert and oriented to person, place, and time.     (all labs ordered are listed, but only abnormal results are displayed) Labs Reviewed  CBC WITH DIFFERENTIAL/PLATELET - Abnormal; Notable for the following components:      Result Value   RBC 4.20 (*)    Eosinophils Absolute 0.6 (*)    All other components within normal limits  COMPREHENSIVE METABOLIC PANEL WITH GFR - Abnormal; Notable for the following components:   Glucose, Bld 137 (*)     Creatinine, Ser 1.30 (*)    Total Protein 6.4 (*)    Albumin 3.0 (*)    AST 74 (*)    ALT 131 (*)    GFR, Estimated 56 (*)    All other components within normal limits  PROTIME-INR  APTT  SEDIMENTATION RATE  C-REACTIVE PROTEIN    EKG: None  Radiology: No results found.   Procedures   Medications Ordered in the ED  ibuprofen (ADVIL) tablet 400 mg (has no administration in time range)  methylPREDNISolone  sodium succinate (SOLU-MEDROL ) 125 mg/2 mL injection 125 mg (has no administration in time range)                                    Medical Decision Making Patient complains of a rash that began on October 26.  Rash began after taking azithromax.    Amount and/or Complexity of Data Reviewed Labs: ordered. Decision-making details documented in ED Course.    Details: CBC shows normal wbc count  Creatine is 1.30. AST 74, ALT 131   Discussion of management or test interpretation with external provider(s): Pt's care turned over to Dr. Mannie at 10:30 am.   Risk Prescription drug management.        Final diagnoses:  Rash    ED Discharge Orders     None          Flint Sonny POUR, [place] 01/14/24 2231    Mannie Pac T, DO 01/15/24 2258

## 2024-01-14 NOTE — ED Triage Notes (Signed)
 Pt sent here from PCP for possible John Melendez Syndrome. Pt taking azythromycin  and developed a rash all over his body. Pt also c.o body aches all over and spiked a fever as high as 102 yesterday

## 2024-01-14 NOTE — ED Triage Notes (Signed)
 Pt reports that his doctor thinks that he has stevens Johnson's syndrome. Pt reports that he has a fever and a rash. Pt was put on azithromycin due to a cold. Pt has blistering rash on his hand and reports a rash on his legs as well.

## 2024-01-14 NOTE — ED Triage Notes (Signed)
 Pt reports headache, neck pain and generalized body aches.

## 2024-01-14 NOTE — ED Notes (Signed)
 [facility] called general surgery will call or dermatology will call for possible transfer

## 2024-01-15 MED ORDER — FAMOTIDINE 20 MG PO TABS
20.0000 mg | ORAL_TABLET | Freq: Once | ORAL | Status: AC
  Administered 2024-01-15: 20 mg via ORAL
  Filled 2024-01-15: qty 1

## 2024-01-15 MED ORDER — IBUPROFEN 400 MG PO TABS
400.0000 mg | ORAL_TABLET | Freq: Once | ORAL | Status: AC
  Administered 2024-01-15: 400 mg via ORAL
  Filled 2024-01-15: qty 1

## 2024-01-15 MED ORDER — LISINOPRIL 10 MG PO TABS
10.0000 mg | ORAL_TABLET | Freq: Every day | ORAL | Status: DC
  Administered 2024-01-15: 10 mg via ORAL
  Filled 2024-01-15: qty 1

## 2024-01-15 NOTE — ED Provider Notes (Signed)
 I assumed care of this patient from previous provider.  Please see their note for further details of history, exam, and MDM.   Briefly patient is a 78 y.o. male who presented fever and blistering rash. Concerning for SJS, pending transfer to [facility]. Awaiting call back for bed.  6:15 AM Bed currently not available, but patient placed on waitlist. Patient updated. On reassessment, no change.   7:07 AM Still waiting call from [facility].  Patient care turned over to oncoming provider. Patient case and results discussed in detail; please see their note for further ED managment.      Trine Raynell Moder, MD 01/15/24 [phone]

## 2024-01-15 NOTE — Discharge Instructions (Addendum)
 You should hear from the scheduling office at [facility] in the next 1-2 business days to schedule your close follow-up appointment.  You will need to follow-up in their office for your rash.  You can also follow-up with your own primary care doctor.

## 2024-01-15 NOTE — ED Notes (Signed)
 Pt in bed, pt reports slight headache and requests motrin, pt awake and oriented, pt moving all extremities, pt has vesicle like rash on L hand, pt also has some redness in R sclera. Pt denies problems with vision, pt pupils are 4mm and reactive to light, resps even and unlabored, pt awaits transfer to [facility]. Md notified about pain med request.

## 2024-01-15 NOTE — ED Notes (Signed)
 [facility] called us  back , they have no rooms available to take this patient as a transfer. They have added pt to wait list. Will call us  when a room is ready

## 2024-01-15 NOTE — ED Provider Notes (Signed)
 Patient was reassessed this morning.  The blistering is resolved on his left palm.  Does not spread any further.  He denies any blurred vision or pain in his right eye.  I rediscussed the case with the [facility] transfer center and they are accepting physician on-call.  They also discussed with the dermatology resident on-call.  Collectively you have agreed that the patient would be stable for urgent close outpatient follow-up with dermatology, which they can arrange in their office in the next week.  They are notifying their scheduler.  We do not see an indication for transfer at this point as the patient has been stable for 26-hour observation in the ED, with no further evidence of SJS.  The patient is quite eager to go home.  Differential does remain broad as he continues to complain of intermittent fevers for the past 1 to 2 weeks, and a rash that began 1 day BEFORE he started doxycycline .  I am not certain that the doxycycline  was the offending culprit here.  However he has completed nearly a week of doxycycline  already and advised that he discontinue this.  His inflammatory markers are nearly normal, and his white blood cell count is normal, there is no indication of bacteremia.  Vasculitis would be on the differential, but I think he would benefit from dermatology evaluation foremost.    Patient is following up with urology as an outpatient in 1 month for his bladder wall thickening.   Cottie Donnice PARAS, MD 01/15/24 [phone]

## 2024-03-18 ENCOUNTER — Encounter: Payer: Self-pay | Admitting: Urology

## 2024-03-18 ENCOUNTER — Other Ambulatory Visit: Payer: Self-pay | Admitting: Urology

## 2024-03-18 DIAGNOSIS — R972 Elevated prostate specific antigen [PSA]: Secondary | ICD-10-CM

## 2024-04-23 ENCOUNTER — Other Ambulatory Visit
